# Patient Record
Sex: Male | Born: 1940 | Race: Black or African American | Hispanic: No | Smoking: Former smoker
Health system: Southern US, Community
[De-identification: ages and names within clinical notes are randomized; demographics above are authoritative.]

## PROBLEM LIST (undated history)

## (undated) DIAGNOSIS — I10 Essential (primary) hypertension: Secondary | ICD-10-CM

## (undated) DIAGNOSIS — E7800 Pure hypercholesterolemia, unspecified: Secondary | ICD-10-CM

## (undated) HISTORY — PX: COLONOSCOPY: SHX174

---

## 2008-09-07 ENCOUNTER — Emergency Department (HOSPITAL_COMMUNITY): Admission: EM | Admit: 2008-09-07 | Discharge: 2008-09-07 | Payer: Self-pay | Admitting: Emergency Medicine

## 2008-12-17 ENCOUNTER — Inpatient Hospital Stay (HOSPITAL_COMMUNITY): Admission: EM | Admit: 2008-12-17 | Discharge: 2008-12-17 | Payer: Self-pay | Admitting: Emergency Medicine

## 2009-01-31 ENCOUNTER — Telehealth (INDEPENDENT_AMBULATORY_CARE_PROVIDER_SITE_OTHER): Payer: Self-pay | Admitting: *Deleted

## 2009-09-29 ENCOUNTER — Ambulatory Visit: Payer: Self-pay | Admitting: Vascular Surgery

## 2009-09-29 ENCOUNTER — Encounter (INDEPENDENT_AMBULATORY_CARE_PROVIDER_SITE_OTHER): Payer: Self-pay | Admitting: Cardiology

## 2009-09-29 ENCOUNTER — Ambulatory Visit (HOSPITAL_COMMUNITY): Admission: RE | Admit: 2009-09-29 | Discharge: 2009-09-29 | Payer: Self-pay | Admitting: Cardiology

## 2010-06-11 ENCOUNTER — Emergency Department (HOSPITAL_COMMUNITY): Admission: EM | Admit: 2010-06-11 | Discharge: 2010-06-11 | Payer: Self-pay | Admitting: Emergency Medicine

## 2010-11-21 LAB — CK TOTAL AND CKMB (NOT AT ARMC)
CK, MB: 2.6 ng/mL (ref 0.3–4.0)
Relative Index: 1.2 (ref 0.0–2.5)
Total CK: 222 U/L (ref 7–232)

## 2010-11-21 LAB — POCT CARDIAC MARKERS
CKMB, poc: 1.7 ng/mL (ref 1.0–8.0)
Troponin i, poc: <0.05 ng/mL (ref 0.00–0.09)

## 2010-11-21 LAB — CBC
HCT: 36.7 % — ABNORMAL LOW (ref 39.0–52.0)
MCV: 85.4 fL (ref 78.0–100.0)
Platelets: 276 10*3/uL (ref 150–400)
RDW: 16 % — ABNORMAL HIGH (ref 11.5–15.5)
WBC: 6.4 10*3/uL (ref 4.0–10.5)

## 2010-11-21 LAB — URIC ACID: Uric Acid, Serum: 7.9 mg/dL — ABNORMAL HIGH (ref 4.0–7.8)

## 2010-11-21 LAB — PROTIME-INR
INR: 1 (ref 0.00–1.49)
Prothrombin Time: 13.5 seconds (ref 11.6–15.2)

## 2010-11-21 LAB — BASIC METABOLIC PANEL
BUN: 26 mg/dL — ABNORMAL HIGH (ref 6–23)
CO2: 26 mEq/L (ref 19–32)
Calcium: 9.1 mg/dL (ref 8.4–10.5)
Creatinine, Ser: 1.32 mg/dL (ref 0.4–1.5)
GFR calc Af Amer: >60 mL/min (ref >60)

## 2010-11-21 LAB — DIFFERENTIAL
Eosinophils Absolute: 0.1 10*3/uL (ref 0.0–0.7)
Eosinophils Relative: 2 % (ref 0–5)
Lymphs Abs: 2.8 10*3/uL (ref 0.7–4.0)
Monocytes Absolute: 0.7 10*3/uL (ref 0.1–1.0)

## 2010-12-29 NOTE — Discharge Summary (Signed)
NAME:  Gary Pierce, Gary Pierce                 ACCOUNT NO.:  [id number]   MEDICAL RECORD NO.:  [id number]          PATIENT TYPE:  INP   LOCATION:  94                         FACILITY:  [facility]   PHYSICIAN:  Mohan N. Terrence Dupont, M.D. DATE OF BIRTH:  02/16/1941   DATE OF ADMISSION:  12/16/2008  DATE OF DISCHARGE:  12/17/2008                               DISCHARGE SUMMARY   ADMITTING DIAGNOSES:  1. Chest pain, sinus bradycardia, rule out coronary insufficiency.  2. Coronary artery disease.  3. History of myocardial infarction in the past.  4. Hypertension.  5. Gastroesophageal reflux disease.  6. History of gouty arthritis.  7. History of tobacco abuse.   DISCHARGE DIAGNOSIS:  1. Chest pain, sinus bradycardia, rule out coronary insufficiency.  2. Coronary artery disease.  3. History of myocardial infarction in the past.  4. Hypertension.  5. Gastroesophageal reflux disease.  6. History of gouty arthritis.  7. History of tobacco abuse.   The patient signed out against medical advice.  The patient has been  advised to follow up with Dr. Montez Morita and continue his home medications.   BRIEF HISTORY AND HOSPITAL COURSE:  Gary Pierce is 70 year old black male  with past medical history significant for coronary artery disease,  history of inferior wall MI in the past, hypertension, gouty arthritis  came to the ER complaining of retrosternal chest pain off and on for the  last few months, grade 7/10 without associated symptoms of nausea,  vomiting, or diaphoresis.  He states initially it starts as abdominal  pain, radiating to the chest.  He took over-the-counter medications for  acid reflux with relief.  Denies any exertional chest pain.  Denies  shortness of breath.  Denies palpitation, lightheadedness, dizziness, or  syncope.  Denies PND, orthopnea, or leg swelling.  Denies any cardiac  workup in recent past.  Denies cough, fever, or chills.   PAST MEDICAL HISTORY:  As above.   PAST SURGICAL  HISTORY:  He has history of gingival surgery in the past.   No allergies.   MEDICATIONS AT HOME:  He is on:  1. Diovan HCT 320/12.5 p.o. daily.  2. Allopurinol 1 tablet daily.  3. Naprosyn 1 tablet twice daily as needed.   SOCIAL HISTORY:  He is divorced, one daughter, smoked one-half pack per  day for 25 years, quit 5 years ago.  No history of alcohol abuse.  He is  retired, worked in [employer].   FAMILY HISTORY:  Father died at age of 1 due to MI.  Mother died of  cancer at age of 68.  One brother died of cirrhosis of liver.  He had  history of alcohol abuse at the age of 45.  One sister during child  birth.   PHYSICAL EXAMINATION:  GENERAL:  He is alert, awake, and oriented x3, in  no acute distress.  VITAL SIGNS:  Blood pressure was 117/65, pulse was 54 and regular.  HEENT:  Conjunctivae was pink.  NECK:  Supple.  No JVD, no bruit.  LUNGS:  Clear to auscultation without rhonchi or rales.  CARDIOVASCULAR:  S1 and  S2 were normal.  There was soft systolic murmur.  There was no S3 gallop.  ABDOMEN:  Soft.  Bowel sounds are present and nontender.  EXTREMITIES:  There is no clubbing, cyanosis, or edema.   EKG showed sinus bradycardia, possible old inferior wall MI, nonspecific  T-wave changes.   LABORATORY DATA:  His CK was 222, MB 2.6, relative index 1.2, troponin I  was 0.02 and 0.05.  CK-MB was 1.7, troponin I was less than 0.05.  His D-  dimer was 0.42, which was in normal range.  Hemoglobin was 12.9,  hematocrit 36.7, white count of 6.4.  Uric acid level was 7.9.  Sodium  was 138, potassium 4.1, chloride 108, bicarb 26, glucose 97, BUN 26,  creatinine 1.32.   BRIEF HOSPITAL COURSE:  The patient was admitted to telemetry unit.  MI  was ruled out by serial enzymes.  The patient was discussed at length  regarding noninvasive stress testing versus left cath, possible PTCA  stenting, its risks and benefits.  The patient initially consented for a  stress test but then decided  to leave against medical advice.  The  patient did not have any episodes of chest pain during the hospital  stay.  The patient understands the risk of leaving the hospital against  medical advice and which may result in MI death and wanted to leave  against medical advice and wanted followup as outpatient with Dr.  Montez Morita.      Allegra Lai. Terrence Dupont, M.D.  Electronically Signed     MNH/MEDQ  D:  02/25/2009  T:  02/26/2009  Job:  [id number]

## 2012-09-08 ENCOUNTER — Other Ambulatory Visit: Payer: Self-pay | Admitting: *Deleted

## 2012-09-08 DIAGNOSIS — I83893 Varicose veins of bilateral lower extremities with other complications: Secondary | ICD-10-CM

## 2012-09-09 ENCOUNTER — Other Ambulatory Visit: Payer: Self-pay | Admitting: *Deleted

## 2012-09-09 DIAGNOSIS — I83893 Varicose veins of bilateral lower extremities with other complications: Secondary | ICD-10-CM

## 2012-10-01 ENCOUNTER — Encounter: Payer: Self-pay | Admitting: Vascular Surgery

## 2012-10-02 ENCOUNTER — Encounter: Payer: Self-pay | Admitting: Vascular Surgery

## 2012-10-02 ENCOUNTER — Ambulatory Visit (INDEPENDENT_AMBULATORY_CARE_PROVIDER_SITE_OTHER): Payer: Medicare Other | Admitting: Vascular Surgery

## 2012-10-02 ENCOUNTER — Encounter (INDEPENDENT_AMBULATORY_CARE_PROVIDER_SITE_OTHER): Payer: Medicare Other | Admitting: *Deleted

## 2012-10-02 VITALS — BP 136/83 | HR 66 | Ht 70.0 in | Wt 223.0 lb

## 2012-10-02 DIAGNOSIS — I83893 Varicose veins of bilateral lower extremities with other complications: Secondary | ICD-10-CM | POA: Insufficient documentation

## 2012-10-02 DIAGNOSIS — M79609 Pain in unspecified limb: Secondary | ICD-10-CM

## 2012-10-02 NOTE — Progress Notes (Signed)
[facility] HISTORY AND PHYSICAL   History of Present Illness:  Patient is a 72 y.o. year old male who presents for evaluation of varicose veins. These have been chronic and progressive over several years. He has occasional intermittent aching pain especially in the right inner thigh area. He has tried compression therapy some in the past. He is not currently wearing compression stockings. He states he does have some left leg varicosities but primarily his concern with his right leg. He denies any prior history of DVT. He has no family history of varicose veins. He does occasionally has some swelling in his left foot. Other medical problems include gout and elevated cholesterol both of which are currently stable.  Past medical history: Gout and elevated cholesterol History reviewed. No pertinent past surgical history.   Social History History  Substance Use Topics  . Smoking status: Former Smoker -- 25 years    Types: Cigarettes    Quit date: 09/18/2012  . Smokeless tobacco: Never Used  . Alcohol Use: No    Family History Family History  Problem Relation Age of Onset  . Cancer Mother   . Heart disease Father   . Hyperlipidemia Father   . Hypertension Father   . Heart attack Father     Allergies  No Known Allergies   Current Outpatient Prescriptions  Medication Sig Dispense Refill  . allopurinol (ZYLOPRIM) 300 MG tablet Take 1 tablet by mouth daily.      Marland Kitchen atorvastatin (LIPITOR) 10 MG tablet Take 1 tablet by mouth daily.      Marland Kitchen COLCRYS 0.6 MG tablet Take 1 tablet by mouth daily.      Marland Kitchen EXFORGE 10-320 MG per tablet Take 1 tablet by mouth daily.      . Tamsulosin HCl (FLOMAX PO) Take by mouth daily.       No current facility-administered medications for this visit.    ROS:   General:  No weight loss, Fever, chills  HEENT: No recent headaches, no nasal bleeding, no visual changes, no sore throat  Neurologic: No dizziness, blackouts,  seizures. No recent symptoms of stroke or mini- stroke. No recent episodes of slurred speech, or temporary blindness.  Cardiac: No recent episodes of chest pain/pressure, no shortness of breath at rest.  No shortness of breath with exertion.  Denies history of atrial fibrillation or irregular heartbeat  Vascular: No history of rest pain in feet.  No history of claudication.  No history of non-healing ulcer, No history of DVT   Pulmonary: No home oxygen, no productive cough, no hemoptysis,  No asthma or wheezing  Musculoskeletal:  [ ]  Arthritis, [ ]  Low back pain,  [ x] Joint pain  Hematologic:No history of hypercoagulable state.  No history of easy bleeding.  No history of anemia  Gastrointestinal: No hematochezia or melena,  No gastroesophageal reflux, no trouble swallowing  Urinary: [ ]  chronic Kidney disease, [ ]  on HD - [ ]  MWF or [ ]  TTHS, [ ]  Burning with urination, [ ]  Frequent urination, [ ]  Difficulty urinating;   Skin: No rashes  Psychological: No history of anxiety,  No history of depression   Physical Examination  Filed Vitals:   10/02/12 1518  BP: 136/83  Pulse: 66  Height: 5\' 10"  (1.778 m)  Weight: 223 lb (101.152 kg)  SpO2: 100%    Body mass index is 32 kg/(m^2).  General:  Alert and oriented, no acute distress HEENT: Normal Neck: No bruit or JVD  Pulmonary: Clear to auscultation bilaterally Cardiac: Regular Rate and Rhythm without murmur Abdomen: Soft, non-tender, non-distended, no mass, no scars Skin: No rash, no ulcer, large cluster of palpable varicosities on the right medial calf 3-4 mm. Several clusters of reticular and spider-type veins right medial inner thigh this is the site that was painful to him, similar small clusters left leg not as prominent Extremity Pulses:  2+ radial, brachial, femoral, 1-2+ dorsalis pedis, posterior tibial pulses bilaterally Musculoskeletal: No deformity trace edema bilaterally  Neurologic: Upper and lower extremity motor  5/5 and symmetric  DATA: The patient had a venous duplex exam today of the right lower extremity. This showed no evidence of DVT. He did have reflux in the superficial femoral and popliteal veins. 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 He was also given a prescription today for bilateral lower extremity compression stockings. He'll return in 3 months to see if his symptoms have improved and to consider possible laser ablation of his greater and lesser saphenous vein in the right leg. He may also require some work on the reticular veins and I will leave this to Dr. Evelena Leyden discretion when he sees him in 3 months.  Ruta Hinds, MD [facility] Office: [phone] Pager: [phone]

## 2012-12-29 ENCOUNTER — Encounter: Payer: Self-pay | Admitting: Vascular Surgery

## 2012-12-30 ENCOUNTER — Ambulatory Visit: Payer: Medicare Other | Admitting: Vascular Surgery

## 2013-01-12 ENCOUNTER — Encounter (HOSPITAL_COMMUNITY): Payer: Self-pay | Admitting: *Deleted

## 2013-01-12 ENCOUNTER — Emergency Department (HOSPITAL_COMMUNITY)
Admission: EM | Admit: 2013-01-12 | Discharge: 2013-01-12 | Disposition: A | Discharge status: Home / Self Care | Payer: PRIVATE HEALTH INSURANCE | Attending: Emergency Medicine | Admitting: Emergency Medicine

## 2013-01-12 ENCOUNTER — Emergency Department (HOSPITAL_COMMUNITY): Payer: PRIVATE HEALTH INSURANCE

## 2013-01-12 DIAGNOSIS — E7800 Pure hypercholesterolemia, unspecified: Secondary | ICD-10-CM | POA: Insufficient documentation

## 2013-01-12 DIAGNOSIS — M25562 Pain in left knee: Secondary | ICD-10-CM

## 2013-01-12 DIAGNOSIS — Z8679 Personal history of other diseases of the circulatory system: Secondary | ICD-10-CM | POA: Insufficient documentation

## 2013-01-12 DIAGNOSIS — M25569 Pain in unspecified knee: Secondary | ICD-10-CM | POA: Insufficient documentation

## 2013-01-12 DIAGNOSIS — Z79899 Other long term (current) drug therapy: Secondary | ICD-10-CM | POA: Insufficient documentation

## 2013-01-12 DIAGNOSIS — I10 Essential (primary) hypertension: Secondary | ICD-10-CM | POA: Insufficient documentation

## 2013-01-12 DIAGNOSIS — Z87891 Personal history of nicotine dependence: Secondary | ICD-10-CM | POA: Insufficient documentation

## 2013-01-12 HISTORY — DX: Essential (primary) hypertension: I10

## 2013-01-12 HISTORY — DX: Pure hypercholesterolemia, unspecified: E78.00

## 2013-01-12 MED ORDER — HYDROCODONE-IBUPROFEN 7.5-200 MG PO TABS: 1.0000 | ORAL_TABLET | Freq: Four times a day (QID) | ORAL | Status: DC | PRN

## 2013-01-12 NOTE — ED Notes (Signed)
Pt is here with left knee pain for a couple of weeks.  Denies injury.

## 2013-01-12 NOTE — ED Provider Notes (Signed)
History     CSN: [id number]  Arrival date & time 01/12/13  1227   First MD Initiated Contact with Patient 01/12/13 1314      Chief Complaint  Patient presents with  . Leg Pain    (Consider location/radiation/quality/duration/timing/severity/associated sxs/prior treatment) HPI  72 year old male presents c/o L knee pain.  Pt reports intermittent pain to the lateral aspect of his L knee ongoing for the past 2 weeks.  Pain is a sharp sensation, non radiating, worsening with weight bearing and movement and improves with rest. No report of injury.  NO fever, chills, cp, sob, doe, back pain, hip or ankle pain.  He takes Aleve on occasion without relief.  He decided to quit smoking a month ago.  No hx of gout.  Pt has hx of varicose vein and has been seen by vascular surgeon Dr. Oneida Alar in feb.  He was given RICe therapy, NSAIDs, and compression stocking as treatment which has helped.  No recent surgery, prolonged bedrest, long trip, hx of cancer.  No prior PE/DVT.  Past Medical History  Diagnosis Date  . Hypertension   . Hypercholesterolemia     History reviewed. No pertinent past surgical history.  Family History  Problem Relation Age of Onset  . Cancer Mother   . Heart disease Father   . Hyperlipidemia Father   . Hypertension Father   . Heart attack Father     History  Substance Use Topics  . Smoking status: Former Smoker -- 25 years    Types: Cigarettes    Quit date: 09/18/2012  . Smokeless tobacco: Never Used  . Alcohol Use: No      Review of Systems  All other systems reviewed and are negative.    Allergies  Review of patient's allergies indicates no known allergies.  Home Medications   Current Outpatient Rx  Name  Route  Sig  Dispense  Refill  . allopurinol (ZYLOPRIM) 300 MG tablet   Oral   Take 1 tablet by mouth daily.         Marland Kitchen atorvastatin (LIPITOR) 10 MG tablet   Oral   Take 1 tablet by mouth daily.         Marland Kitchen COLCRYS 0.6 MG tablet   Oral  Take 1 tablet by mouth daily.         Marland Kitchen EXFORGE 10-320 MG per tablet   Oral   Take 1 tablet by mouth daily.         . tamsulosin (FLOMAX) 0.4 MG CAPS   Oral   Take 0.4 mg by mouth daily.           BP 140/68  Pulse 71  Temp(Src) 98.4 F (36.9 C) (Oral)  Resp 18  Ht $R'5\' 8"'dY$  (1.727 m)  Wt 220 lb (99.791 kg)  BMI 33.46 kg/m2  SpO2 97%  Physical Exam  Nursing note and vitals reviewed. Constitutional: He appears well-developed and well-nourished. No distress.  HENT:  Head: Atraumatic.  Neck: Neck supple.  Abdominal: There is no tenderness.  Musculoskeletal: He exhibits edema (L knee: point tenderness to lateral aspect of knee without obvious deformity, swelling, or bruise.  normal knee flexion/extension.  no joint laxity.  bilateral pitting edema to both ankle, non tender. intact distal pulses ).  Neurological: He is alert.    ED Course  Procedures (including critical care time)  2:25 PM Pt with tenderness to L knee but not to entire leg.  Xray unremarkable.  Tenderness along the IT  band.  No significant risk factors for DVT.  Plan to give pain meds, knee sleeve and to f/u with ortho as needed.  Return precaution discussed.  Doubt septic arthritis.  Could likely be gout although no warmth or swelling expected of gout, pt is NVI.  Labs Reviewed - No data to display Dg Knee Complete 4 Views Left  01/12/2013   *RADIOLOGY REPORT*  Clinical Data: Left knee pain and swelling.  LEFT KNEE - COMPLETE 4+ VIEW  Comparison: None.  Findings: Imaged bones, joints and soft tissues appear normal.  IMPRESSION: Negative exam.   Original Report Authenticated By: Orlean Patten, M.D.     1. Knee pain, left       MDM  BP 140/68  Pulse 71  Temp(Src) 98.4 F (36.9 C) (Oral)  Resp 18  Ht $R'5\' 8"'Yl$  (1.727 m)  Wt 220 lb (99.791 kg)  BMI 33.46 kg/m2  SpO2 97%  I have reviewed nursing notes and vital signs. I personally reviewed the imaging tests through PACS system  I reviewed  available ER/hospitalization records thought the EMR         Domenic Moras, [place] 01/12/13 1431

## 2013-01-12 NOTE — ED Notes (Signed)
Pt c/o left knee pain x 2 weeks. No known injury. Thought it was due to smoking so he bought Nicorette and stopped smoking. Pain continues.

## 2013-01-13 NOTE — ED Provider Notes (Signed)
Medical screening examination/treatment/procedure(s) were performed by non-physician practitioner and as supervising physician I was immediately available for consultation/collaboration.   Mylinda Latina III, MD 01/13/13 [phone]

## 2014-04-15 ENCOUNTER — Emergency Department (HOSPITAL_COMMUNITY)
Admission: EM | Admit: 2014-04-15 | Discharge: 2014-04-15 | Disposition: A | Discharge status: Home / Self Care | Payer: PRIVATE HEALTH INSURANCE | Attending: Emergency Medicine | Admitting: Emergency Medicine

## 2014-04-15 ENCOUNTER — Encounter (HOSPITAL_COMMUNITY): Payer: Self-pay | Admitting: Emergency Medicine

## 2014-04-15 DIAGNOSIS — Z87891 Personal history of nicotine dependence: Secondary | ICD-10-CM | POA: Diagnosis not present

## 2014-04-15 DIAGNOSIS — M25569 Pain in unspecified knee: Secondary | ICD-10-CM | POA: Insufficient documentation

## 2014-04-15 DIAGNOSIS — E7800 Pure hypercholesterolemia, unspecified: Secondary | ICD-10-CM | POA: Insufficient documentation

## 2014-04-15 DIAGNOSIS — Z79899 Other long term (current) drug therapy: Secondary | ICD-10-CM | POA: Insufficient documentation

## 2014-04-15 DIAGNOSIS — M79604 Pain in right leg: Secondary | ICD-10-CM

## 2014-04-15 DIAGNOSIS — M79609 Pain in unspecified limb: Secondary | ICD-10-CM

## 2014-04-15 DIAGNOSIS — I10 Essential (primary) hypertension: Secondary | ICD-10-CM | POA: Insufficient documentation

## 2014-04-15 MED ORDER — HYDROCODONE-ACETAMINOPHEN 5-325 MG PO TABS: ORAL_TABLET | ORAL | Status: DC

## 2014-04-15 NOTE — ED Provider Notes (Signed)
CSN: [id number]     Arrival date & time 04/15/14  1434 History  This chart was scribed for non-physician practitioner, Alecia Lemming, PA-C working with Ezequiel Essex, MD by Frederich Balding, ED scribe. This patient was seen in room TR07C/TR07C and the patient's care was started at 3:03 PM.     Chief Complaint  Patient presents with  . Leg Pain    The patient says he has right knee since saturday.  He has been diagnosed with gout and thinks maybe he ate something that may have flared up the gout.  He denies injury but says it started when he was washing his vehicle saturday.   The history is provided by the patient. No language interpreter was used.   HPI Comments: Gary Pierce is a 73 y.o. male who presents to the Emergency Department complaining of gradual onset right posterior knee pain that started 5 days ago. Pain radiates into his upper calf. Denies injury. Pt thinks he might have eaten something that may have caused a gout flare up. He has been taking his gout medications with no relief but states they normally provide relief. Pt has also used a knee brace with no relief. He has not taken any other medications for his symptoms. Ambulation worsens the pain. Pt does not take blood thinners daily. Denies history of blood clots.   Past Medical History  Diagnosis Date  . Hypertension   . Hypercholesterolemia    History reviewed. No pertinent past surgical history. Family History  Problem Relation Age of Onset  . Cancer Mother   . Heart disease Father   . Hyperlipidemia Father   . Hypertension Father   . Heart attack Father    History  Substance Use Topics  . Smoking status: Former Smoker -- 25 years    Types: Cigarettes    Quit date: 09/18/2012  . Smokeless tobacco: Never Used  . Alcohol Use: No    Review of Systems  Constitutional: Negative for fever.  HENT: Negative for congestion.   Eyes: Negative for redness.  Respiratory: Negative for shortness of breath.   Cardiovascular:  Negative for chest pain.  Gastrointestinal: Negative for abdominal distention.  Musculoskeletal: Positive for arthralgias.  Skin: Negative for rash.  Neurological: Negative for speech difficulty.  Psychiatric/Behavioral: Negative for confusion.   Allergies  Review of patient's allergies indicates no known allergies.  Home Medications   Prior to Admission medications   Medication Sig Start Date End Date Taking? Authorizing Provider  allopurinol (ZYLOPRIM) 300 MG tablet Take 1 tablet by mouth daily. 09/12/12   Historical Provider, MD  atorvastatin (LIPITOR) 10 MG tablet Take 1 tablet by mouth daily. 09/12/12   Historical Provider, MD  COLCRYS 0.6 MG tablet Take 1 tablet by mouth daily. 09/23/12   Historical Provider, MD  EXFORGE 10-320 MG per tablet Take 1 tablet by mouth daily. 08/28/12   Historical Provider, MD  HYDROcodone-ibuprofen (VICOPROFEN) 7.5-200 MG per tablet Take 1 tablet by mouth every 6 (six) hours as needed for pain. 01/12/13   Domenic Moras, PA-C  tamsulosin (FLOMAX) 0.4 MG CAPS Take 0.4 mg by mouth daily.    Historical Provider, MD   BP 143/81  Pulse 75  Temp(Src) 97.5 F (36.4 C) (Oral)  Resp 16  SpO2 95%  Physical Exam  Nursing note and vitals reviewed. Constitutional: He is oriented to person, place, and time. He appears well-developed and well-nourished. No distress.  HENT:  Head: Normocephalic and atraumatic.  Eyes: Conjunctivae and EOM are normal.  Neck: Neck supple. No tracheal deviation present.  Cardiovascular: Normal rate.   Pulmonary/Chest: Effort normal. No respiratory distress.  Musculoskeletal: Normal range of motion. He exhibits tenderness. He exhibits no edema.       Right hip: Normal.       Right knee: Normal.       Right ankle: Normal.       Right upper leg: He exhibits no tenderness, no bony tenderness and no swelling.       Right lower leg: He exhibits tenderness. He exhibits no bony tenderness, no swelling and no edema.       Legs:      Right  foot: Normal.  Neurological: He is alert and oriented to person, place, and time.  Skin: Skin is warm and dry.  Psychiatric: He has a normal mood and affect. His behavior is normal.    ED Course  Procedures (including critical care time)  DIAGNOSTIC STUDIES: Oxygen Saturation is 95% on RA, adequate by my interpretation.    COORDINATION OF CARE: 3:08 PM-Discussed treatment plan which includes an ultrasound with pt at bedside and pt agreed to plan.   Labs Review Labs Reviewed - No data to display  Imaging Review No results found.   EKG Interpretation None      Vital signs reviewed and are as follows: Filed Vitals:   04/15/14 1459  BP: 143/81  Pulse: 75  Temp: 97.5 F (36.4 C)  Resp: 16    4:43 PM [place] neg. Pt informed. Will treat pain. Encouraged to f/u with PCP in 3 days if not improved.   Pt unable to take NSAIDs.   Pt to use pain medication only under direct supervision at the lowest possible dose needed to control your pain.   Exam unchanged at discharge.     MDM   Final diagnoses:  Pain of right lower extremity   Patient with R calf pain -- no real knee pain on exam, FROM of joint, no effusion or redness. Do not suspect gout. Pt with h/o venous insufficiency. Ultrasound ordered to rule out DVT. This was negative. Pain is not consistent with claudication or arterial issue. Pain is not consistent with neuropathic pain. It is worse with ambulation and movement. Will treat with analgesics and have patient follow up with PCP as needed. Lower extremity is neurovascularly intact.  I personally performed the services described in this documentation, which was scribed in my presence. The recorded information has been reviewed and is accurate.  Carlisle Cater, PA-C 04/15/14 1646

## 2014-04-15 NOTE — Progress Notes (Signed)
VASCULAR LAB PRELIMINARY  PRELIMINARY  PRELIMINARY  PRELIMINARY  Right lower extremity venous Doppler completed.    Preliminary report:  There is no DVT or SVT noted in the right lower extremity.   Braniya Farrugia, RVT 04/15/2014, 4:13 PM

## 2014-04-15 NOTE — Discharge Instructions (Signed)
Please read and follow all provided instructions.  Your diagnoses today include:  1. Pain of right lower extremity    Tests performed today include:  Ultrasound of your leg - does not show any signs of blood clots  Vital signs. See below for your results today.   Medications prescribed:   Vicodin (hydrocodone/acetaminophen) - narcotic pain medication  DO NOT drive or perform any activities that require you to be awake and alert because this medicine can make you drowsy. BE VERY CAREFUL not to take multiple medicines containing Tylenol (also called acetaminophen). Doing so can lead to an overdose which can damage your liver and cause liver failure and possibly death.  Use pain medication only under direct supervision at the lowest possible dose needed to control your pain.   Take any prescribed medications only as directed.  Home care instructions:   Follow any educational materials contained in this packet  Follow R.I.C.E. Protocol:  R - rest your injury   I  - use ice on injury without applying directly to skin  C - compress injury with bandage or splint  E - elevate the injury as much as possible  Follow-up instructions: Please follow-up with your primary care provider if you continue to have significant pain or trouble walking in 3 days.  Return instructions:   Please return if your toes are numb or tingling, appear gray or blue, or you have severe pain (also elevate leg and loosen splint or wrap if you were given one)  Please return to the Emergency Department if you experience worsening symptoms.   Please return if you have any other emergent concerns.  Additional Information:  Your vital signs today were: BP 143/81   Pulse 75   Temp(Src) 97.5 F (36.4 C) (Oral)   Resp 16   SpO2 95% If your blood pressure (BP) was elevated above 135/85 this visit, please have this repeated by your doctor within one month. --------------

## 2014-04-15 NOTE — ED Notes (Signed)
The patient says he has right knee since saturday.  He has been diagnosed with gout and thinks maybe he ate something that may have flared up the gout.  He denies injury but says it started when he was washing his vehicle saturday.  He rates his pain 9/10.

## 2014-04-15 NOTE — ED Notes (Signed)
Declined W/C at D/C and was escorted to lobby by RN. 

## 2014-04-15 NOTE — ED Provider Notes (Signed)
Medical screening examination/treatment/procedure(s) were performed by non-physician practitioner and as supervising physician I was immediately available for consultation/collaboration.   EKG Interpretation None        Ezequiel Essex, MD 04/15/14 2336

## 2014-07-19 ENCOUNTER — Encounter: Payer: Self-pay | Admitting: Internal Medicine

## 2014-07-19 ENCOUNTER — Ambulatory Visit: Payer: PRIVATE HEALTH INSURANCE | Attending: Internal Medicine | Admitting: Internal Medicine

## 2014-07-19 VITALS — BP 160/90 | HR 67 | Temp 98.0°F | Resp 16 | Wt 139.8 lb

## 2014-07-19 DIAGNOSIS — N529 Male erectile dysfunction, unspecified: Secondary | ICD-10-CM | POA: Diagnosis not present

## 2014-07-19 DIAGNOSIS — J45901 Unspecified asthma with (acute) exacerbation: Secondary | ICD-10-CM | POA: Insufficient documentation

## 2014-07-19 DIAGNOSIS — E785 Hyperlipidemia, unspecified: Secondary | ICD-10-CM | POA: Insufficient documentation

## 2014-07-19 DIAGNOSIS — Z87891 Personal history of nicotine dependence: Secondary | ICD-10-CM | POA: Diagnosis not present

## 2014-07-19 DIAGNOSIS — N400 Enlarged prostate without lower urinary tract symptoms: Secondary | ICD-10-CM | POA: Diagnosis not present

## 2014-07-19 DIAGNOSIS — M109 Gout, unspecified: Secondary | ICD-10-CM | POA: Diagnosis present

## 2014-07-19 DIAGNOSIS — J4521 Mild intermittent asthma with (acute) exacerbation: Secondary | ICD-10-CM | POA: Diagnosis not present

## 2014-07-19 DIAGNOSIS — Z8639 Personal history of other endocrine, nutritional and metabolic disease: Secondary | ICD-10-CM

## 2014-07-19 DIAGNOSIS — J45909 Unspecified asthma, uncomplicated: Secondary | ICD-10-CM | POA: Insufficient documentation

## 2014-07-19 DIAGNOSIS — I10 Essential (primary) hypertension: Secondary | ICD-10-CM | POA: Diagnosis not present

## 2014-07-19 DIAGNOSIS — Z8739 Personal history of other diseases of the musculoskeletal system and connective tissue: Secondary | ICD-10-CM | POA: Insufficient documentation

## 2014-07-19 DIAGNOSIS — E782 Mixed hyperlipidemia: Secondary | ICD-10-CM | POA: Insufficient documentation

## 2014-07-19 LAB — CBC WITH DIFFERENTIAL/PLATELET
BASOS ABS: 0 10*3/uL (ref 0.0–0.1)
BASOS PCT: 0 % (ref 0–1)
EOS ABS: 0.2 10*3/uL (ref 0.0–0.7)
EOS PCT: 3 % (ref 0–5)
HCT: 35.6 % — ABNORMAL LOW (ref 39.0–52.0)
Hemoglobin: 12.7 g/dL — ABNORMAL LOW (ref 13.0–17.0)
LYMPHS PCT: 49 % — AB (ref 12–46)
Lymphs Abs: 2.9 10*3/uL (ref 0.7–4.0)
MCH: 28.4 pg (ref 26.0–34.0)
MCHC: 35.7 g/dL (ref 30.0–36.0)
MCV: 79.6 fL (ref 78.0–100.0)
MONO ABS: 0.6 10*3/uL (ref 0.1–1.0)
MPV: 10.4 fL (ref 9.4–12.4)
Monocytes Relative: 10 % (ref 3–12)
Neutro Abs: 2.2 10*3/uL (ref 1.7–7.7)
Neutrophils Relative %: 38 % — ABNORMAL LOW (ref 43–77)
PLATELETS: 240 10*3/uL (ref 150–400)
RBC: 4.47 MIL/uL (ref 4.22–5.81)
RDW: 15.4 % (ref 11.5–15.5)
WBC: 5.9 10*3/uL (ref 4.0–10.5)

## 2014-07-19 LAB — COMPLETE METABOLIC PANEL WITH GFR
ALT: 27 U/L (ref 0–53)
AST: 29 U/L (ref 0–37)
Albumin: 3.8 g/dL (ref 3.5–5.2)
Alkaline Phosphatase: 74 U/L (ref 39–117)
BILIRUBIN TOTAL: 0.8 mg/dL (ref 0.2–1.2)
BUN: 14 mg/dL (ref 6–23)
CALCIUM: 9.2 mg/dL (ref 8.4–10.5)
CHLORIDE: 103 meq/L (ref 96–112)
CO2: 29 mEq/L (ref 19–32)
CREATININE: 1.27 mg/dL (ref 0.50–1.35)
GFR, Est African American: 63 mL/min
GFR, Est Non African American: 55 mL/min — ABNORMAL LOW
Glucose, Bld: 94 mg/dL (ref 70–99)
Potassium: 4.6 mEq/L (ref 3.5–5.3)
Sodium: 141 mEq/L (ref 135–145)
Total Protein: 6.7 g/dL (ref 6.0–8.3)

## 2014-07-19 LAB — LIPID PANEL
CHOLESTEROL: 156 mg/dL (ref 0–200)
HDL: 45 mg/dL (ref >39)
LDL CALC: 89 mg/dL (ref 0–99)
Total CHOL/HDL Ratio: 3.5 Ratio
Triglycerides: 111 mg/dL (ref <150)
VLDL: 22 mg/dL (ref 0–40)

## 2014-07-19 LAB — URIC ACID: URIC ACID, SERUM: 4.6 mg/dL (ref 4.0–7.8)

## 2014-07-19 LAB — HEMOGLOBIN A1C
Hgb A1c MFr Bld: 6 % — ABNORMAL HIGH (ref <5.7)
Mean Plasma Glucose: 126 mg/dL — ABNORMAL HIGH (ref <117)

## 2014-07-19 MED ORDER — ALBUTEROL SULFATE (2.5 MG/3ML) 0.083% IN NEBU: 2.5000 mg | INHALATION_SOLUTION | Freq: Four times a day (QID) | RESPIRATORY_TRACT | Status: DC | PRN

## 2014-07-19 MED ORDER — COLCHICINE 0.6 MG PO TABS: 0.6000 mg | ORAL_TABLET | ORAL | Status: DC

## 2014-07-19 MED ORDER — TADALAFIL 5 MG PO TABS: 5.0000 mg | ORAL_TABLET | Freq: Every day | ORAL | Status: DC | PRN

## 2014-07-19 MED ORDER — AMLODIPINE BESYLATE 2.5 MG PO TABS: 2.5000 mg | ORAL_TABLET | Freq: Every day | ORAL | Status: DC

## 2014-07-19 NOTE — Progress Notes (Signed)
Patient here to establish care  has history of asthma and htn Here for  Routine physical

## 2014-07-19 NOTE — Progress Notes (Signed)
Patient Demographics  Gary Pierce, is a 73 y.o. male  EGB:[id number]  [id number]:[id number]  DOB - 1941/01/04  CC:  Chief Complaint  Patient presents with  . Establish Care       HPI: [name] is a 73 y.o. male here today to establish medical care.patient has history of gout, hyperlipidemia, asthma, hypertension, BPH, erectile dysfunction, his blood pressure today is borderline elevated patient denies any headache dizziness chest and shortness of breath, currently patient is taking Benicar 40 mg daily, reviewed previous medical record he used to be on amlodipine as well , as per patient he had a gout attack 2 weeks ago which is now improved and is requesting refill on colchicine and he also takes allopurinol, he is also requesting refill on Cialis which she used when necessary for erectile dysfunction. Patient denies any previous history of heart disease. Patient has No headache, No chest pain, No abdominal pain - No Nausea, No new weakness tingling or numbness, No Cough - SOB.  No Known Allergies Past Medical History  Diagnosis Date  . Hypertension   . Hypercholesterolemia    Current Outpatient Prescriptions on File Prior to Visit  Medication Sig Dispense Refill  . alfuzosin (UROXATRAL) 10 MG 24 hr tablet Take 10 mg by mouth daily.    Marland Kitchen allopurinol (ZYLOPRIM) 300 MG tablet Take 300 mg by mouth daily.     Marland Kitchen atorvastatin (LIPITOR) 10 MG tablet Take 10 mg by mouth daily.     Marland Kitchen BENICAR 40 MG tablet Take 40 mg by mouth daily.    Marland Kitchen HYDROcodone-acetaminophen (NORCO/VICODIN) 5-325 MG per tablet Take 1/2-1 tablets every 6 hours as needed for severe pain. 8 tablet 0   No current facility-administered medications on file prior to visit.   Family History  Problem Relation Age of Onset  . Cancer Mother   . Heart disease Father   . Hyperlipidemia Father   . Hypertension Father   . Heart attack Father    History   Social History  . Marital Status: Legally Separated    Spouse Name:  N/A    Number of Children: N/A  . Years of Education: N/A   Occupational History  . Not on file.   Social History Main Topics  . Smoking status: Former Smoker -- 25 years    Types: Cigarettes    Quit date: 09/18/2012  . Smokeless tobacco: Never Used  . Alcohol Use: No  . Drug Use: No  . Sexual Activity: Not on file   Other Topics Concern  . Not on file   Social History Narrative    Review of Systems: Constitutional: Negative for fever, chills, diaphoresis, activity change, appetite change and fatigue. HENT: Negative for ear pain, nosebleeds, congestion, facial swelling, rhinorrhea, neck pain, neck stiffness and ear discharge.  Eyes: Negative for pain, discharge, redness, itching and visual disturbance. Respiratory: Negative for cough, choking, chest tightness, shortness of breath, wheezing and stridor.  Cardiovascular: Negative for chest pain, palpitations and leg swelling. Gastrointestinal: Negative for abdominal distention. Genitourinary: Negative for dysuria, urgency, frequency, hematuria, flank pain, decreased urine volume, difficulty urinating and dyspareunia.  Musculoskeletal: Negative for back pain, joint swelling, arthralgia and gait problem. Neurological: Negative for dizziness, tremors, seizures, syncope, facial asymmetry, speech difficulty, weakness, light-headedness, numbness and headaches.  Hematological: Negative for adenopathy. Does not bruise/bleed easily. Psychiatric/Behavioral: Negative for hallucinations, behavioral problems, confusion, dysphoric mood, decreased concentration and agitation.    Objective:   Filed Vitals:   07/19/14 0935  BP:  160/90  Pulse:   Temp:   Resp:     Physical Exam: Constitutional: Patient appears well-developed and well-nourished. No distress. HENT: Normocephalic, atraumatic, External right and left ear normal. Oropharynx is clear and moist.  Eyes: Conjunctivae and EOM are normal. PERRLA, no scleral icterus. Neck: Normal  ROM. Neck supple. No JVD. No tracheal deviation. No thyromegaly. CVS: RRR, S1/S2 +, no murmurs, no gallops, no carotid bruit.  Pulmonary: Effort and breath sounds normal, no stridor, rhonchi, wheezes, rales.  Abdominal: Soft. BS +, no distension, tenderness, rebound or guarding.  Musculoskeletal: Normal range of motion. No edema and no tenderness.  Neuro: Alert. Normal reflexes, muscle tone coordination. No cranial nerve deficit. Skin: Skin is warm and dry. No rash noted. Not diaphoretic. No erythema. No pallor. Psychiatric: Normal mood and affect. Behavior, judgment, thought content normal.  Lab Results  Component Value Date   WBC 6.4 12/16/2008   HGB 12.9* 12/16/2008   HCT 36.7* 12/16/2008   MCV 85.4 12/16/2008   PLT 276 12/16/2008   Lab Results  Component Value Date   CREATININE 1.32 12/16/2008   BUN 26* 12/16/2008   NA 138 12/16/2008   K 4.1 12/16/2008   CL 108 12/16/2008   CO2 26 12/16/2008    No results found for: HGBA1C Lipid Panel  No results found for: CHOL, TRIG, HDL, CHOLHDL, VLDL, LDLCALC     Assessment and plan:   1. History of gout  - colchicine (COLCRYS) 0.6 MG tablet; Take 1 tablet (0.6 mg total) by mouth every other day.  Dispense: 30 tablet; Refill: 2 - Uric Acid  2. Essential hypertension Blood pressure is uncontrolled, patient will continue with Benicar I have added amlodipine 2.5 mg daily, patient will come back in 2 weeks for nurse visit BP check. Also ordered blood work. - CBC with Differential - COMPLETE METABOLIC PANEL WITH GFR - Vit D  25 hydroxy (rtn osteoporosis monitoring) - Hemoglobin A1c - amLODipine (NORVASC) 2.5 MG tablet; Take 1 tablet (2.5 mg total) by mouth daily.  Dispense: 90 tablet; Refill: 3  3. BPH (benign prostatic hyperplasia) Currently patient is taking alfuzosin  symptoms are stable.  4. Hyperlipidemia Currently patient is on Lipitor 10 mg, will check lipid panel. - Lipid panel  5. Erectile dysfunction, unspecified  erectile dysfunction type Medication refill done, again advised patient not to take this medication along with his blood pressure medications, patient understand and verbalized instructions. - tadalafil (CIALIS) 5 MG tablet; Take 1 tablet (5 mg total) by mouth daily as needed for erectile dysfunction.  Dispense: 30 tablet; Refill: 3  6. Asthma with acute exacerbation, mild intermittent  - albuterol (PROVENTIL) (2.5 MG/3ML) 0.083% nebulizer solution; Take 3 mLs (2.5 mg total) by nebulization every 6 (six) hours as needed for wheezing or shortness of breath.  Dispense: 75 mL; Refill: 3      Health Maintenance -Colonoscopy: uptodate   -Vaccinations:  uptodate with flu shot   Return in about 3 months (around 10/18/2014) for hypertension, hyperipidemia, BP check in 2 weeks/Nurse Visit.  Lorayne Marek, MD

## 2014-07-19 NOTE — Patient Instructions (Signed)
DASH Eating Plan DASH stands for "Dietary Approaches to Stop Hypertension." The DASH eating plan is a healthy eating plan that has been shown to reduce high blood pressure (hypertension). Additional health benefits may include reducing the risk of type 2 diabetes mellitus, heart disease, and stroke. The DASH eating plan may also help with weight loss. WHAT DO I NEED TO KNOW ABOUT THE DASH EATING PLAN? For the DASH eating plan, you will follow these general guidelines:  Choose foods with a percent daily value for sodium of less than 5% (as listed on the food label).  Use salt-free seasonings or herbs instead of table salt or sea salt.  Check with your health care provider or pharmacist before using salt substitutes.  Eat lower-sodium products, often labeled as "lower sodium" or "no salt added."  Eat fresh foods.  Eat more vegetables, fruits, and low-fat dairy products.  Choose whole grains. Look for the word "whole" as the first word in the ingredient list.  Choose fish and skinless chicken or [place] more often than red meat. Limit fish, poultry, and meat to 6 oz (170 g) each day.  Limit sweets, desserts, sugars, and sugary drinks.  Choose heart-healthy fats.  Limit cheese to 1 oz (28 g) per day.  Eat more home-cooked food and less restaurant, buffet, and fast food.  Limit fried foods.  Cook foods using methods other than frying.  Limit canned vegetables. If you do use them, rinse them well to decrease the sodium.  When eating at a restaurant, ask that your food be prepared with less salt, or no salt if possible. WHAT FOODS CAN I EAT? Seek help from a dietitian for individual calorie needs. Grains Whole grain or whole wheat bread. Brown rice. Whole grain or whole wheat pasta. Quinoa, bulgur, and whole grain cereals. Low-sodium cereals. Corn or whole wheat flour tortillas. Whole grain cornbread. Whole grain crackers. Low-sodium crackers. Vegetables Fresh or frozen vegetables  (raw, steamed, roasted, or grilled). Low-sodium or reduced-sodium tomato and vegetable juices. Low-sodium or reduced-sodium tomato sauce and paste. Low-sodium or reduced-sodium canned vegetables.  Fruits All fresh, canned (in natural juice), or frozen fruits. Meat and Other Protein Products Ground beef (85% or leaner), grass-fed beef, or beef trimmed of fat. Skinless chicken or [place]. Ground chicken or [place]. Pork trimmed of fat. All fish and seafood. Eggs. Dried beans, peas, or lentils. Unsalted nuts and seeds. Unsalted canned beans. Dairy Low-fat dairy products, such as skim or 1% milk, 2% or reduced-fat cheeses, low-fat ricotta or cottage cheese, or plain low-fat yogurt. Low-sodium or reduced-sodium cheeses. Fats and Oils Tub margarines without trans fats. Light or reduced-fat mayonnaise and salad dressings (reduced sodium). Avocado. Safflower, olive, or canola oils. Natural peanut or almond butter. Other Unsalted popcorn and pretzels. The items listed above may not be a complete list of recommended foods or beverages. Contact your dietitian for more options. WHAT FOODS ARE NOT RECOMMENDED? Grains White bread. White pasta. White rice. Refined cornbread. Bagels and croissants. Crackers that contain trans fat. Vegetables Creamed or fried vegetables. Vegetables in a cheese sauce. Regular canned vegetables. Regular canned tomato sauce and paste. Regular tomato and vegetable juices. Fruits Dried fruits. Canned fruit in light or heavy syrup. Fruit juice. Meat and Other Protein Products Fatty cuts of meat. Ribs, chicken wings, bacon, sausage, bologna, salami, chitterlings, fatback, hot dogs, bratwurst, and packaged luncheon meats. Salted nuts and seeds. Canned beans with salt. Dairy Whole or 2% milk, cream, half-and-half, and cream cheese. Whole-fat or sweetened yogurt. Full-fat  cheeses or blue cheese. Nondairy creamers and whipped toppings. Processed cheese, cheese spreads, or cheese  curds. Condiments Onion and garlic salt, seasoned salt, table salt, and sea salt. Canned and packaged gravies. Worcestershire sauce. Tartar sauce. Barbecue sauce. Teriyaki sauce. Soy sauce, including reduced sodium. Steak sauce. Fish sauce. Oyster sauce. Cocktail sauce. Horseradish. Ketchup and mustard. Meat flavorings and tenderizers. Bouillon cubes. Hot sauce. Tabasco sauce. Marinades. Taco seasonings. Relishes. Fats and Oils Butter, stick margarine, lard, shortening, ghee, and bacon fat. Coconut, palm kernel, or palm oils. Regular salad dressings. Other Pickles and olives. Salted popcorn and pretzels. The items listed above may not be a complete list of foods and beverages to avoid. Contact your dietitian for more information. WHERE CAN I FIND MORE INFORMATION? National Heart, [employer], and [employer] Institute: travelstabloid.com Document Released: 07/19/2011 Document Revised: 12/14/2013 Document Reviewed: 06/03/2013 [facility] Patient Information 2015 [place], [place]. This information is not intended to replace advice given to you by your health care provider. Make sure you discuss any questions you have with your health care provider. Gout Gout is an inflammatory arthritis caused by a buildup of uric acid crystals in the joints. Uric acid is a chemical that is normally present in the blood. When the level of uric acid in the blood is too high it can form crystals that deposit in your joints and tissues. This causes joint redness, soreness, and swelling (inflammation). Repeat attacks are common. Over time, uric acid crystals can form into masses (tophi) near a joint, destroying bone and causing disfigurement. Gout is treatable and often preventable. CAUSES  The disease begins with elevated levels of uric acid in the blood. Uric acid is produced by your body when it breaks down a naturally found substance called purines. Certain foods you eat, such as meats and fish, contain  high amounts of purines. Causes of an elevated uric acid level include:  Being passed down from parent to child (heredity).  Diseases that cause increased uric acid production (such as obesity, psoriasis, and certain cancers).  Excessive alcohol use.  Diet, especially diets rich in meat and seafood.  Medicines, including certain cancer-fighting medicines (chemotherapy), water pills (diuretics), and aspirin.  Chronic kidney disease. The kidneys are no longer able to remove uric acid well.  Problems with metabolism. Conditions strongly associated with gout include:  Obesity.  High blood pressure.  High cholesterol.  Diabetes. Not everyone with elevated uric acid levels gets gout. It is not understood why some people get gout and others do not. Surgery, joint injury, and eating too much of certain foods are some of the factors that can lead to gout attacks. SYMPTOMS   An attack of gout comes on quickly. It causes intense pain with redness, swelling, and warmth in a joint.  Fever can occur.  Often, only one joint is involved. Certain joints are more commonly involved:  Base of the big toe.  Knee.  Ankle.  Wrist.  Finger. Without treatment, an attack usually goes away in a few days to weeks. Between attacks, you usually will not have symptoms, which is different from many other forms of arthritis. DIAGNOSIS  Your caregiver will suspect gout based on your symptoms and exam. In some cases, tests may be recommended. The tests may include:  Blood tests.  Urine tests.  X-rays.  Joint fluid exam. This exam requires a needle to remove fluid from the joint (arthrocentesis). Using a microscope, gout is confirmed when uric acid crystals are seen in the joint fluid. TREATMENT  There  are two phases to gout treatment: treating the sudden onset (acute) attack and preventing attacks (prophylaxis).  Treatment of an Acute Attack.  Medicines are used. These include  anti-inflammatory medicines or steroid medicines.  An injection of steroid medicine into the affected joint is sometimes necessary.  The painful joint is rested. Movement can worsen the arthritis.  You may use warm or cold treatments on painful joints, depending which works best for you.  Treatment to Prevent Attacks.  If you suffer from frequent gout attacks, your caregiver may advise preventive medicine. These medicines are started after the acute attack subsides. These medicines either help your kidneys eliminate uric acid from your body or decrease your uric acid production. You may need to stay on these medicines for a very long time.  The early phase of treatment with preventive medicine can be associated with an increase in acute gout attacks. For this reason, during the first few months of treatment, your caregiver may also advise you to take medicines usually used for acute gout treatment. Be sure you understand your caregiver's directions. Your caregiver may make several adjustments to your medicine dose before these medicines are effective.  Discuss dietary treatment with your caregiver or dietitian. Alcohol and drinks high in sugar and fructose and foods such as meat, poultry, and seafood can increase uric acid levels. Your caregiver or dietitian can advise you on drinks and foods that should be limited. HOME CARE INSTRUCTIONS   Do not take aspirin to relieve pain. This raises uric acid levels.  Only take over-the-counter or prescription medicines for pain, discomfort, or fever as directed by your caregiver.  Rest the joint as much as possible. When in bed, keep sheets and blankets off painful areas.  Keep the affected joint raised (elevated).  Apply warm or cold treatments to painful joints. Use of warm or cold treatments depends on which works best for you.  Use crutches if the painful joint is in your leg.  Drink enough fluids to keep your urine clear or pale yellow. This  helps your body get rid of uric acid. Limit alcohol, sugary drinks, and fructose drinks.  Follow your dietary instructions. Pay careful attention to the amount of protein you eat. Your daily diet should emphasize fruits, vegetables, whole grains, and fat-free or low-fat milk products. Discuss the use of coffee, vitamin C, and cherries with your caregiver or dietitian. These may be helpful in lowering uric acid levels.  Maintain a healthy body weight. SEEK MEDICAL CARE IF:   You develop diarrhea, vomiting, or any side effects from medicines.  You do not feel better in 24 hours, or you are getting worse. SEEK IMMEDIATE MEDICAL CARE IF:   Your joint becomes suddenly more tender, and you have chills or a fever. MAKE SURE YOU:   Understand these instructions.  Will watch your condition.  Will get help right away if you are not doing well or get worse. Document Released: 07/27/2000 Document Revised: 12/14/2013 Document Reviewed: 03/12/2012 [facility] Patient Information 2015 [place], [place]. This information is not intended to replace advice given to you by your health care provider. Make sure you discuss any questions you have with your health care provider.

## 2014-07-20 ENCOUNTER — Telehealth: Payer: Self-pay | Admitting: Emergency Medicine

## 2014-07-20 LAB — VITAMIN D 25 HYDROXY (VIT D DEFICIENCY, FRACTURES): Vit D, 25-Hydroxy: 17 ng/mL — ABNORMAL LOW (ref 30–100)

## 2014-07-20 MED ORDER — VITAMIN D (ERGOCALCIFEROL) 1.25 MG (50000 UNIT) PO CAPS: 50000.0000 [IU] | ORAL_CAPSULE | ORAL | Status: DC

## 2014-07-20 NOTE — Telephone Encounter (Signed)
Pt given lab results with instructions on watching diet/low fat carbohydrates as well as vitamin D levels Medication e-scribed to [facility] pharmacy

## 2014-07-20 NOTE — Telephone Encounter (Signed)
-----   Message from Lorayne Marek, MD sent at 07/20/2014 11:28 AM EST ----- Blood work reviewed, noticed low vitamin D, call patient advise to start ergocalciferol 50,000 units once a week for the duration of  12 weeks.  noticed hemoglobin A1c of 6.0%, patient has prediabetes, call and advise patient for low carbohydrate diet.

## 2014-08-02 ENCOUNTER — Ambulatory Visit: Payer: PRIVATE HEALTH INSURANCE | Attending: Internal Medicine | Admitting: *Deleted

## 2014-08-02 VITALS — BP 123/67 | HR 75 | Temp 98.7°F | Resp 18

## 2014-08-02 DIAGNOSIS — I10 Essential (primary) hypertension: Secondary | ICD-10-CM | POA: Diagnosis not present

## 2014-08-02 MED ORDER — ALBUTEROL SULFATE HFA 108 (90 BASE) MCG/ACT IN AERS: 2.0000 | INHALATION_SPRAY | Freq: Four times a day (QID) | RESPIRATORY_TRACT | Status: DC | PRN

## 2014-08-02 MED ORDER — OLMESARTAN MEDOXOMIL 40 MG PO TABS: 40.0000 mg | ORAL_TABLET | Freq: Every day | ORAL | Status: DC

## 2014-08-02 NOTE — Patient Instructions (Signed)
Diabetes Mellitus and Food It is important for you to manage your blood sugar (glucose) level. Your blood glucose level can be greatly affected by what you eat. Eating healthier foods in the appropriate amounts throughout the day at about the same time each day will help you control your blood glucose level. It can also help slow or prevent worsening of your diabetes mellitus. Healthy eating may even help you improve the level of your blood pressure and reach or maintain a healthy weight.  HOW CAN FOOD AFFECT ME? Carbohydrates Carbohydrates affect your blood glucose level more than any other type of food. Your dietitian will help you determine how many carbohydrates to eat at each meal and teach you how to count carbohydrates. Counting carbohydrates is important to keep your blood glucose at a healthy level, especially if you are using insulin or taking certain medicines for diabetes mellitus. Alcohol Alcohol can cause sudden decreases in blood glucose (hypoglycemia), especially if you use insulin or take certain medicines for diabetes mellitus. Hypoglycemia can be a life-threatening condition. Symptoms of hypoglycemia (sleepiness, dizziness, and disorientation) are similar to symptoms of having too much alcohol.  If your health care provider has given you approval to drink alcohol, do so in moderation and use the following guidelines:  Women should not have more than one drink per day, and men should not have more than two drinks per day. One drink is equal to:  12 oz of beer.  5 oz of wine.  1 oz of hard liquor.  Do not drink on an empty stomach.  Keep yourself hydrated. Have water, diet soda, or unsweetened iced tea.  Regular soda, juice, and other mixers might contain a lot of carbohydrates and should be counted. WHAT FOODS ARE NOT RECOMMENDED? As you make food choices, it is important to remember that all foods are not the same. Some foods have fewer nutrients per serving than other  foods, even though they might have the same number of calories or carbohydrates. It is difficult to get your body what it needs when you eat foods with fewer nutrients. Examples of foods that you should avoid that are high in calories and carbohydrates but low in nutrients include:  Trans fats (most processed foods list trans fats on the Nutrition Facts label).  Regular soda.  Juice.  Candy.  Sweets, such as cake, pie, doughnuts, and cookies.  Fried foods. WHAT FOODS CAN I EAT? Have nutrient-rich foods, which will nourish your body and keep you healthy. The food you should eat also will depend on several factors, including:  The calories you need.  The medicines you take.  Your weight.  Your blood glucose level.  Your blood pressure level.  Your cholesterol level. You also should eat a variety of foods, including:  Protein, such as meat, poultry, fish, tofu, nuts, and seeds (lean animal proteins are best).  Fruits.  Vegetables.  Dairy products, such as milk, cheese, and yogurt (low fat is best).  Breads, grains, pasta, cereal, rice, and beans.  Fats such as olive oil, trans fat-free margarine, canola oil, avocado, and olives. DOES EVERYONE WITH DIABETES MELLITUS HAVE THE SAME MEAL PLAN? Because every person with diabetes mellitus is different, there is not one meal plan that works for everyone. It is very important that you meet with a dietitian who will help you create a meal plan that is just right for you. Document Released: 04/26/2005 Document Revised: 08/04/2013 Document Reviewed: 06/26/2013 [facility] Patient Information 2015 [place], [place]. This  information is not intended to replace advice given to you by your health care provider. Make sure you discuss any questions you have with your health care provider. Diabetes and Exercise Exercising regularly is important. It is not just about losing weight. It has many health benefits, such as:  Improving your overall  fitness, flexibility, and endurance.  Increasing your bone density.  Helping with weight control.  Decreasing your body fat.  Increasing your muscle strength.  Reducing stress and tension.  Improving your overall health. People with diabetes who exercise gain additional benefits because exercise:  Reduces appetite.  Improves the body's use of blood sugar (glucose).  Helps lower or control blood glucose.  Decreases blood pressure.  Helps control blood lipids (such as cholesterol and triglycerides).  Improves the body's use of the hormone insulin by:  Increasing the body's insulin sensitivity.  Reducing the body's insulin needs.  Decreases the risk for heart disease because exercising:  Lowers cholesterol and triglycerides levels.  Increases the levels of good cholesterol (such as high-density lipoproteins [HDL]) in the body.  Lowers blood glucose levels. YOUR ACTIVITY PLAN  Choose an activity that you enjoy and set realistic goals. Your health care provider or diabetes educator can help you make an activity plan that works for you. Exercise regularly as directed by your health care provider. This includes:  Performing resistance training twice a week such as push-ups, sit-ups, lifting weights, or using resistance bands.  Performing 150 minutes of cardio exercises each week such as walking, running, or playing sports.  Staying active and spending no more than 90 minutes at one time being inactive. Even short bursts of exercise are good for you. Three 10-minute sessions spread throughout the day are just as beneficial as a single 30-minute session. Some exercise ideas include:  Taking the dog for a walk.  Taking the stairs instead of the elevator.  Dancing to your favorite song.  Doing an exercise video.  Doing your favorite exercise with a friend. RECOMMENDATIONS FOR EXERCISING WITH TYPE 1 OR TYPE 2 DIABETES   Check your blood glucose before exercising. If  blood glucose levels are greater than 240 mg/dL, check for urine ketones. Do not exercise if ketones are present.  Avoid injecting insulin into areas of the body that are going to be exercised. For example, avoid injecting insulin into:  The arms when playing tennis.  The legs when jogging.  Keep a record of:  Food intake before and after you exercise.  Expected peak times of insulin action.  Blood glucose levels before and after you exercise.  The type and amount of exercise you have done.  Review your records with your health care provider. Your health care provider will help you to develop guidelines for adjusting food intake and insulin amounts before and after exercising.  If you take insulin or oral hypoglycemic agents, watch for signs and symptoms of hypoglycemia. They include:  Dizziness.  Shaking.  Sweating.  Chills.  Confusion.  Drink plenty of water while you exercise to prevent dehydration or heat stroke. Body water is lost during exercise and must be replaced.  Talk to your health care provider before starting an exercise program to make sure it is safe for you. Remember, almost any type of activity is better than none. Document Released: 10/20/2003 Document Revised: 12/14/2013 Document Reviewed: 01/06/2013 [facility] Patient Information 2015 [place], [place]. This information is not intended to replace advice given to you by your health care provider. Make sure you discuss any  questions you have with your health care provider.

## 2014-08-02 NOTE — Progress Notes (Signed)
Patient presents for BP check Med list reviewed; states taking Benicar as directed States not taking amlodipine or lipitor States taking a different med for cholesterol  Paient does not have nebulizer and is requesting albuterol MDI Labs reviewed with patient States he follows low sodium diet and has now cut out sugars States he takes Vit D supplement but not the one recently ordered due to mucous production while taking it   BP 123/67 P 75 R 18  T  98.7 oral SPO2 96%  Per PCP: Okay to d/c amlodipine; will restart in future if needed Refill albuterol inhaler  Pt will call back to let us know the name of cholesterol med Patient given literature on Diabetes and Food and Diabetes and Exercise Recommended walking 30 min/day as tolerated  Patient advised to call for med refills at least 7 days before running out so as not to go without. Patient aware that he is to f/u with PCP 3 months from last visit (Due 10/18/14)

## 2014-08-17 ENCOUNTER — Telehealth: Payer: Self-pay | Admitting: *Deleted

## 2014-08-17 ENCOUNTER — Other Ambulatory Visit: Payer: Self-pay

## 2014-08-17 MED ORDER — ALLOPURINOL 300 MG PO TABS: 300.0000 mg | ORAL_TABLET | Freq: Every day | ORAL | Status: DC

## 2014-08-17 NOTE — Telephone Encounter (Signed)
Pt here to make an appointment for medicine refill

## 2014-08-19 ENCOUNTER — Other Ambulatory Visit: Payer: Self-pay

## 2014-08-19 MED ORDER — ALLOPURINOL 300 MG PO TABS: 300.0000 mg | ORAL_TABLET | Freq: Every day | ORAL | Status: DC

## 2014-09-24 ENCOUNTER — Other Ambulatory Visit: Payer: Self-pay | Admitting: Internal Medicine

## 2014-09-26 ENCOUNTER — Other Ambulatory Visit: Payer: Self-pay | Admitting: Internal Medicine

## 2014-10-19 ENCOUNTER — Ambulatory Visit (HOSPITAL_COMMUNITY)
Admission: RE | Admit: 2014-10-19 | Discharge: 2014-10-19 | Disposition: A | Discharge status: Home / Self Care | Payer: Medicare Other | Source: Ambulatory Visit | Attending: Internal Medicine | Admitting: Internal Medicine

## 2014-10-19 ENCOUNTER — Ambulatory Visit: Payer: Medicare Other | Attending: Internal Medicine | Admitting: Internal Medicine

## 2014-10-19 ENCOUNTER — Ambulatory Visit (HOSPITAL_COMMUNITY)
Admission: RE | Admit: 2014-10-19 | Discharge: 2014-10-19 | Disposition: A | Discharge status: Home / Self Care | Payer: Medicare Other | Source: Ambulatory Visit

## 2014-10-19 ENCOUNTER — Telehealth: Payer: Self-pay

## 2014-10-19 ENCOUNTER — Encounter: Payer: Self-pay | Admitting: Internal Medicine

## 2014-10-19 ENCOUNTER — Ambulatory Visit (HOSPITAL_COMMUNITY): Payer: Medicare Other

## 2014-10-19 ENCOUNTER — Other Ambulatory Visit: Payer: Self-pay

## 2014-10-19 VITALS — BP 140/80 | HR 63 | Temp 98.0°F | Resp 16 | Wt 230.4 lb

## 2014-10-19 DIAGNOSIS — I10 Essential (primary) hypertension: Secondary | ICD-10-CM

## 2014-10-19 DIAGNOSIS — E780 Pure hypercholesterolemia: Secondary | ICD-10-CM | POA: Diagnosis not present

## 2014-10-19 DIAGNOSIS — J9811 Atelectasis: Secondary | ICD-10-CM | POA: Diagnosis not present

## 2014-10-19 DIAGNOSIS — Z87891 Personal history of nicotine dependence: Secondary | ICD-10-CM | POA: Insufficient documentation

## 2014-10-19 DIAGNOSIS — R079 Chest pain, unspecified: Secondary | ICD-10-CM

## 2014-10-19 DIAGNOSIS — R0781 Pleurodynia: Secondary | ICD-10-CM | POA: Insufficient documentation

## 2014-10-19 DIAGNOSIS — Z8709 Personal history of other diseases of the respiratory system: Secondary | ICD-10-CM

## 2014-10-19 DIAGNOSIS — J45909 Unspecified asthma, uncomplicated: Secondary | ICD-10-CM | POA: Diagnosis not present

## 2014-10-19 DIAGNOSIS — E559 Vitamin D deficiency, unspecified: Secondary | ICD-10-CM | POA: Diagnosis not present

## 2014-10-19 DIAGNOSIS — Z79899 Other long term (current) drug therapy: Secondary | ICD-10-CM | POA: Diagnosis not present

## 2014-10-19 DIAGNOSIS — Z8249 Family history of ischemic heart disease and other diseases of the circulatory system: Secondary | ICD-10-CM | POA: Diagnosis not present

## 2014-10-19 DIAGNOSIS — Z79891 Long term (current) use of opiate analgesic: Secondary | ICD-10-CM | POA: Insufficient documentation

## 2014-10-19 DIAGNOSIS — R7303 Prediabetes: Secondary | ICD-10-CM

## 2014-10-19 DIAGNOSIS — R05 Cough: Secondary | ICD-10-CM | POA: Diagnosis present

## 2014-10-19 DIAGNOSIS — R7309 Other abnormal glucose: Secondary | ICD-10-CM

## 2014-10-19 LAB — COMPLETE METABOLIC PANEL WITH GFR
ALT: 26 U/L (ref 0–53)
AST: 28 U/L (ref 0–37)
Albumin: 3.9 g/dL (ref 3.5–5.2)
Alkaline Phosphatase: 54 U/L (ref 39–117)
BILIRUBIN TOTAL: 0.9 mg/dL (ref 0.2–1.2)
BUN: 18 mg/dL (ref 6–23)
CO2: 26 meq/L (ref 19–32)
CREATININE: 1.12 mg/dL (ref 0.50–1.35)
Calcium: 8.9 mg/dL (ref 8.4–10.5)
Chloride: 104 mEq/L (ref 96–112)
GFR, EST NON AFRICAN AMERICAN: 64 mL/min
GFR, Est African American: 74 mL/min
Glucose, Bld: 86 mg/dL (ref 70–99)
Potassium: 4.1 mEq/L (ref 3.5–5.3)
SODIUM: 140 meq/L (ref 135–145)
TOTAL PROTEIN: 6.5 g/dL (ref 6.0–8.3)

## 2014-10-19 MED ORDER — AZITHROMYCIN 250 MG PO TABS: ORAL_TABLET | ORAL | Status: DC

## 2014-10-19 NOTE — Patient Instructions (Signed)
Diabetes Mellitus and Food It is important for you to manage your blood sugar (glucose) level. Your blood glucose level can be greatly affected by what you eat. Eating healthier foods in the appropriate amounts throughout the day at about the same time each day will help you control your blood glucose level. It can also help slow or prevent worsening of your diabetes mellitus. Healthy eating may even help you improve the level of your blood pressure and reach or maintain a healthy weight.  HOW CAN FOOD AFFECT ME? Carbohydrates Carbohydrates affect your blood glucose level more than any other type of food. Your dietitian will help you determine how many carbohydrates to eat at each meal and teach you how to count carbohydrates. Counting carbohydrates is important to keep your blood glucose at a healthy level, especially if you are using insulin or taking certain medicines for diabetes mellitus. Alcohol Alcohol can cause sudden decreases in blood glucose (hypoglycemia), especially if you use insulin or take certain medicines for diabetes mellitus. Hypoglycemia can be a life-threatening condition. Symptoms of hypoglycemia (sleepiness, dizziness, and disorientation) are similar to symptoms of having too much alcohol.  If your health care provider has given you approval to drink alcohol, do so in moderation and use the following guidelines:  Women should not have more than one drink per day, and men should not have more than two drinks per day. One drink is equal to:  12 oz of beer.  5 oz of wine.  1 oz of hard liquor.  Do not drink on an empty stomach.  Keep yourself hydrated. Have water, diet soda, or unsweetened iced tea.  Regular soda, juice, and other mixers might contain a lot of carbohydrates and should be counted. WHAT FOODS ARE NOT RECOMMENDED? As you make food choices, it is important to remember that all foods are not the same. Some foods have fewer nutrients per serving than other  foods, even though they might have the same number of calories or carbohydrates. It is difficult to get your body what it needs when you eat foods with fewer nutrients. Examples of foods that you should avoid that are high in calories and carbohydrates but low in nutrients include:  Trans fats (most processed foods list trans fats on the Nutrition Facts label).  Regular soda.  Juice.  Candy.  Sweets, such as cake, pie, doughnuts, and cookies.  Fried foods. WHAT FOODS CAN I EAT? Have nutrient-rich foods, which will nourish your body and keep you healthy. The food you should eat also will depend on several factors, including:  The calories you need.  The medicines you take.  Your weight.  Your blood glucose level.  Your blood pressure level.  Your cholesterol level. You also should eat a variety of foods, including:  Protein, such as meat, poultry, fish, tofu, nuts, and seeds (lean animal proteins are best).  Fruits.  Vegetables.  Dairy products, such as milk, cheese, and yogurt (low fat is best).  Breads, grains, pasta, cereal, rice, and beans.  Fats such as olive oil, trans fat-free margarine, canola oil, avocado, and olives. DOES EVERYONE WITH DIABETES MELLITUS HAVE THE SAME MEAL PLAN? Because every person with diabetes mellitus is different, there is not one meal plan that works for everyone. It is very important that you meet with a dietitian who will help you create a meal plan that is just right for you. Document Released: 04/26/2005 Document Revised: 08/04/2013 Document Reviewed: 06/26/2013 [employer] Patient Information 2015 [employer]. This   information is not intended to replace advice given to you by your health care provider. Make sure you discuss any questions you have with your health care provider. DASH Eating Plan DASH stands for "Dietary Approaches to Stop Hypertension." The DASH eating plan is a healthy eating plan that has been shown to reduce high  blood pressure (hypertension). Additional health benefits may include reducing the risk of type 2 diabetes mellitus, heart disease, and stroke. The DASH eating plan may also help with weight loss. WHAT DO I NEED TO KNOW ABOUT THE DASH EATING PLAN? For the DASH eating plan, you will follow these general guidelines:  Choose foods with a percent daily value for sodium of less than 5% (as listed on the food label).  Use salt-free seasonings or herbs instead of table salt or sea salt.  Check with your health care provider or pharmacist before using salt substitutes.  Eat lower-sodium products, often labeled as "lower sodium" or "no salt added."  Eat fresh foods.  Eat more vegetables, fruits, and low-fat dairy products.  Choose whole grains. Look for the word "whole" as the first word in the ingredient list.  Choose fish and skinless chicken or turkey more often than red meat. Limit fish, poultry, and meat to 6 oz (170 g) each day.  Limit sweets, desserts, sugars, and sugary drinks.  Choose heart-healthy fats.  Limit cheese to 1 oz (28 g) per day.  Eat more home-cooked food and less restaurant, buffet, and fast food.  Limit fried foods.  Cook foods using methods other than frying.  Limit canned vegetables. If you do use them, rinse them well to decrease the sodium.  When eating at a restaurant, ask that your food be prepared with less salt, or no salt if possible. WHAT FOODS CAN I EAT? Seek help from a dietitian for individual calorie needs. Grains Whole grain or whole wheat bread. Brown rice. Whole grain or whole wheat pasta. Quinoa, bulgur, and whole grain cereals. Low-sodium cereals. Corn or whole wheat flour tortillas. Whole grain cornbread. Whole grain crackers. Low-sodium crackers. Vegetables Fresh or frozen vegetables (raw, steamed, roasted, or grilled). Low-sodium or reduced-sodium tomato and vegetable juices. Low-sodium or reduced-sodium tomato sauce and paste. Low-sodium  or reduced-sodium canned vegetables.  Fruits All fresh, canned (in natural juice), or frozen fruits. Meat and Other Protein Products Ground beef (85% or leaner), grass-fed beef, or beef trimmed of fat. Skinless chicken or turkey. Ground chicken or turkey. Pork trimmed of fat. All fish and seafood. Eggs. Dried beans, peas, or lentils. Unsalted nuts and seeds. Unsalted canned beans. Dairy Low-fat dairy products, such as skim or 1% milk, 2% or reduced-fat cheeses, low-fat ricotta or cottage cheese, or plain low-fat yogurt. Low-sodium or reduced-sodium cheeses. Fats and Oils Tub margarines without trans fats. Light or reduced-fat mayonnaise and salad dressings (reduced sodium). Avocado. Safflower, olive, or canola oils. Natural peanut or almond butter. Other Unsalted popcorn and pretzels. The items listed above may not be a complete list of recommended foods or beverages. Contact your dietitian for more options. WHAT FOODS ARE NOT RECOMMENDED? Grains White bread. White pasta. White rice. Refined cornbread. Bagels and croissants. Crackers that contain trans fat. Vegetables Creamed or fried vegetables. Vegetables in a cheese sauce. Regular canned vegetables. Regular canned tomato sauce and paste. Regular tomato and vegetable juices. Fruits Dried fruits. Canned fruit in light or heavy syrup. Fruit juice. Meat and Other Protein Products Fatty cuts of meat. Ribs, chicken wings, bacon, sausage, bologna, salami, chitterlings, fatback, hot   dogs, bratwurst, and packaged luncheon meats. Salted nuts and seeds. Canned beans with salt. Dairy Whole or 2% milk, cream, half-and-half, and cream cheese. Whole-fat or sweetened yogurt. Full-fat cheeses or blue cheese. Nondairy creamers and whipped toppings. Processed cheese, cheese spreads, or cheese curds. Condiments Onion and garlic salt, seasoned salt, table salt, and sea salt. Canned and packaged gravies. Worcestershire sauce. Tartar sauce. Barbecue sauce.  Teriyaki sauce. Soy sauce, including reduced sodium. Steak sauce. Fish sauce. Oyster sauce. Cocktail sauce. Horseradish. Ketchup and mustard. Meat flavorings and tenderizers. Bouillon cubes. Hot sauce. Tabasco sauce. Marinades. Taco seasonings. Relishes. Fats and Oils Butter, stick margarine, lard, shortening, ghee, and bacon fat. Coconut, palm kernel, or palm oils. Regular salad dressings. Other Pickles and olives. Salted popcorn and pretzels. The items listed above may not be a complete list of foods and beverages to avoid. Contact your dietitian for more information. WHERE CAN I FIND MORE INFORMATION? National Heart, Lung, and [employer] Institute: www.nhlbi.nih.gov/health/health-topics/topics/dash/ Document Released: 07/19/2011 Document Revised: 12/14/2013 Document Reviewed: 06/03/2013 [employer] Patient Information 2015 [employer]. This information is not intended to replace advice given to you by your health care provider. Make sure you discuss any questions you have with your health care provider.  

## 2014-10-19 NOTE — Progress Notes (Signed)
Patient here for follow up Patient complains of having chest pain when he bends over Or coughs Symptoms started about a  Week ago

## 2014-10-19 NOTE — Progress Notes (Signed)
MRN: [id number] Name: Blanchard Willhite  Sex: male Age: 74 y.o. DOB: 01/05/1941  Allergies: Review of patient's allergies indicates no known allergies.  Chief Complaint  Patient presents with  . Follow-up    HPI: Patient is 74 y.o. male who has history of hypertension, today's blood pressure is borderline elevated as per patient is not taking his blood pressure medication, he also reported to have chest pain which is worse with coughing and worse with certain movement, denies any fever chills denies any orthopnea PND or leg swelling denies any exertional chest pain. Today EKG done in the office shows normal sinus rhythm with left axis deviation no significant ST-T changes. Patient also had previous blood work done which was reviewed noticed vitamin D deficiency as well ashemoglobin A1c of 6.0%, patient has prediabetes  Past Medical History  Diagnosis Date  . Hypertension   . Hypercholesterolemia     Past Surgical History  Procedure Laterality Date  . Colonoscopy      2015  due in 2017      Medication List       This list is accurate as of: 10/19/14 11:18 AM.  Always use your most recent med list.               albuterol 108 (90 BASE) MCG/ACT inhaler  Commonly known as:  PROVENTIL HFA;VENTOLIN HFA  Inhale 2 puffs into the lungs every 6 (six) hours as needed for wheezing or shortness of breath.     alfuzosin 10 MG 24 hr tablet  Commonly known as:  UROXATRAL  Take 10 mg by mouth daily.     allopurinol 300 MG tablet  Commonly known as:  ZYLOPRIM  TAKE ONE TABLET BY MOUTH ONCE DAILY     atorvastatin 10 MG tablet  Commonly known as:  LIPITOR  Take 10 mg by mouth daily.     colchicine 0.6 MG tablet  Commonly known as:  COLCRYS  Take 1 tablet (0.6 mg total) by mouth every other day.     HYDROcodone-acetaminophen 5-325 MG per tablet  Commonly known as:  NORCO/VICODIN  Take 1/2-1 tablets every 6 hours as needed for severe pain.     olmesartan 40 MG tablet  Commonly  known as:  BENICAR  Take 1 tablet (40 mg total) by mouth daily.     tadalafil 5 MG tablet  Commonly known as:  CIALIS  Take 1 tablet (5 mg total) by mouth daily as needed for erectile dysfunction.     Vitamin D (Ergocalciferol) 50000 UNITS Caps capsule  Commonly known as:  DRISDOL  Take 1 capsule (50,000 Units total) by mouth every 7 (seven) days.        No orders of the defined types were placed in this encounter.    Immunization History  Administered Date(s) Administered  . Influenza-Unspecified 05/03/2014    Family History  Problem Relation Age of Onset  . Cancer Mother   . Heart disease Father   . Hyperlipidemia Father   . Hypertension Father   . Heart attack Father     History  Substance Use Topics  . Smoking status: Former Smoker -- 25 years    Types: Cigarettes    Quit date: 09/18/2012  . Smokeless tobacco: Never Used  . Alcohol Use: No    Review of Systems   As noted in HPI  Filed Vitals:   10/19/14 1016  BP: 140/80  Pulse:   Temp:   Resp:     Physical  Exam  Physical Exam  Constitutional: No distress.  Eyes: EOM are normal. Pupils are equal, round, and reactive to light.  Neck: Neck supple.  Cardiovascular: Normal rate and regular rhythm.   Pulmonary/Chest: Breath sounds normal. No respiratory distress. He has no wheezes. He has no rales.  Some tenderness on the left side chest with palpation  Musculoskeletal: He exhibits no edema.    CBC    Component Value Date/Time   WBC 5.9 07/19/2014 0946   RBC 4.47 07/19/2014 0946   HGB 12.7* 07/19/2014 0946   HCT 35.6* 07/19/2014 0946   PLT 240 07/19/2014 0946   MCV 79.6 07/19/2014 0946   LYMPHSABS 2.9 07/19/2014 0946   MONOABS 0.6 07/19/2014 0946   EOSABS 0.2 07/19/2014 0946   BASOSABS 0.0 07/19/2014 0946    CMP     Component Value Date/Time   NA 141 07/19/2014 0946   K 4.6 07/19/2014 0946   CL 103 07/19/2014 0946   CO2 29 07/19/2014 0946   GLUCOSE 94 07/19/2014 0946   BUN 14  07/19/2014 0946   CREATININE 1.27 07/19/2014 0946   CREATININE 1.32 12/16/2008 2200   CALCIUM 9.2 07/19/2014 0946   PROT 6.7 07/19/2014 0946   ALBUMIN 3.8 07/19/2014 0946   AST 29 07/19/2014 0946   ALT 27 07/19/2014 0946   ALKPHOS 74 07/19/2014 0946   BILITOT 0.8 07/19/2014 0946   GFRNONAA 55* 07/19/2014 0946   GFRNONAA 54* 12/16/2008 2200   GFRAA 63 07/19/2014 0946   GFRAA  12/16/2008 2200    >60        The eGFR has been calculated using the MDRD equation. This calculation has not been validated in all clinical situations. eGFR's persistently <60 mL/min signify possible Chronic Kidney Disease.    Lab Results  Component Value Date/Time   CHOL 156 07/19/2014 09:46 AM    No components found for: HGA1C  Lab Results  Component Value Date/Time   AST 29 07/19/2014 09:46 AM    Assessment and Plan  pleuritic chest pain  - Plan: EKG 12-Lead normal sinus rhythm with no acute ST-T changes, we'll get chest x-ray, DG Chest 2 View  Essential hypertension - Plan: advised patient for DASH diet, continue with current medicationsCOMPLETE METABOLIC PANEL WITH GFR  History of asthma Symptoms are stable continue with albuterol when necessary  Vitamin D deficiency Currently patient is taking vitamin D supplement.  Prediabetes - Plan: Advised patient for low carbohydrate diet, repeat blood chemistry COMPLETE METABOLIC PANEL WITH GFR  Addendum Chest x-ray reported bronchitis changes, since patient is having symptoms, will treat with a Z-Pak  Health Maintenance  -Vaccinations:  Up-to-date with flu shot.  Return in about 3 months (around 01/19/2015) for hypertension.   This note has been created with Surveyor, quantity. Any transcriptional errors are unintentional.    Lorayne Marek, MD

## 2014-10-19 NOTE — Telephone Encounter (Signed)
Spoke with patient about his chest x ray and patient is aware To pick up antibiotic at the pharmacy

## 2014-10-21 ENCOUNTER — Telehealth: Payer: Self-pay

## 2014-10-21 NOTE — Telephone Encounter (Signed)
-----   Message from Lorayne Marek, MD sent at 10/20/2014  9:18 AM EST ----- Call and let the Patient know that blood work is normal.

## 2014-10-21 NOTE — Telephone Encounter (Signed)
Patient not available Left message on voice mail blood work was normal

## 2014-10-25 ENCOUNTER — Other Ambulatory Visit: Payer: Self-pay | Admitting: Internal Medicine

## 2014-10-26 ENCOUNTER — Other Ambulatory Visit: Payer: Self-pay | Admitting: Internal Medicine

## 2014-11-01 ENCOUNTER — Telehealth: Payer: Self-pay | Admitting: General Practice

## 2014-11-01 NOTE — Telephone Encounter (Signed)
Patient requesting medication refill for the following: allopurinol (ZYLOPRIM) 300 MG tablet   . Patient is currently all out of this medication. Please follow up. Patient uses Paediatric nurse on [address]

## 2014-11-02 ENCOUNTER — Other Ambulatory Visit: Payer: Self-pay | Admitting: Internal Medicine

## 2014-11-02 ENCOUNTER — Telehealth: Payer: Self-pay | Admitting: Internal Medicine

## 2014-11-02 NOTE — Telephone Encounter (Signed)
Pt called requesting medication refill on allopurinol (ZYLOPRIM) 300 MG tablet. Pt states he feel his hands swelling up without medication . Please f/u with pt

## 2014-11-08 ENCOUNTER — Other Ambulatory Visit: Payer: Self-pay

## 2014-11-08 ENCOUNTER — Telehealth: Payer: Self-pay

## 2014-11-08 MED ORDER — ALLOPURINOL 300 MG PO TABS: 300.0000 mg | ORAL_TABLET | Freq: Every day | ORAL | Status: DC

## 2014-11-08 NOTE — Telephone Encounter (Signed)
Patient called requesting a refill on his allopurinol Prescriptions sent to [facility]

## 2014-12-03 ENCOUNTER — Other Ambulatory Visit: Payer: Self-pay | Admitting: Internal Medicine

## 2014-12-13 ENCOUNTER — Other Ambulatory Visit: Payer: Self-pay | Admitting: Internal Medicine

## 2015-01-05 ENCOUNTER — Other Ambulatory Visit: Payer: Self-pay | Admitting: Internal Medicine

## 2015-01-11 ENCOUNTER — Telehealth: Payer: Self-pay

## 2015-01-11 ENCOUNTER — Telehealth: Payer: Self-pay | Admitting: Internal Medicine

## 2015-01-11 MED ORDER — OLMESARTAN MEDOXOMIL 40 MG PO TABS: ORAL_TABLET | ORAL | Status: DC

## 2015-01-11 NOTE — Telephone Encounter (Signed)
Patient called stating he ran out of his medications and needs a refill on his benacar Prescription sent to [facility] at [address]

## 2015-01-11 NOTE — Telephone Encounter (Signed)
Patient is calling to request a med refill for BENICAR 40 MG tablet, patient uses [facility] on [address]. Please f/u

## 2015-01-14 ENCOUNTER — Other Ambulatory Visit: Payer: Self-pay | Admitting: Internal Medicine

## 2015-01-18 ENCOUNTER — Other Ambulatory Visit: Payer: Self-pay | Admitting: *Deleted

## 2015-01-18 MED ORDER — ALLOPURINOL 300 MG PO TABS: 300.0000 mg | ORAL_TABLET | Freq: Every day | ORAL | Status: DC

## 2015-01-25 ENCOUNTER — Encounter: Payer: Self-pay | Admitting: Internal Medicine

## 2015-01-25 ENCOUNTER — Ambulatory Visit: Payer: Medicare Other | Attending: Internal Medicine | Admitting: Internal Medicine

## 2015-01-25 VITALS — BP 142/80 | HR 83 | Temp 98.0°F | Resp 16 | Wt 225.8 lb

## 2015-01-25 DIAGNOSIS — Z8639 Personal history of other endocrine, nutritional and metabolic disease: Secondary | ICD-10-CM | POA: Diagnosis not present

## 2015-01-25 DIAGNOSIS — R7309 Other abnormal glucose: Secondary | ICD-10-CM

## 2015-01-25 DIAGNOSIS — I10 Essential (primary) hypertension: Secondary | ICD-10-CM

## 2015-01-25 DIAGNOSIS — E785 Hyperlipidemia, unspecified: Secondary | ICD-10-CM

## 2015-01-25 DIAGNOSIS — R7303 Prediabetes: Secondary | ICD-10-CM

## 2015-01-25 DIAGNOSIS — Z8739 Personal history of other diseases of the musculoskeletal system and connective tissue: Secondary | ICD-10-CM

## 2015-01-25 MED ORDER — COLCHICINE 0.6 MG PO TABS: 0.6000 mg | ORAL_TABLET | ORAL | Status: DC

## 2015-01-25 MED ORDER — ALLOPURINOL 300 MG PO TABS: 300.0000 mg | ORAL_TABLET | Freq: Every day | ORAL | Status: DC

## 2015-01-25 MED ORDER — ATORVASTATIN CALCIUM 10 MG PO TABS: 10.0000 mg | ORAL_TABLET | Freq: Every day | ORAL | Status: DC

## 2015-01-25 MED ORDER — OLMESARTAN MEDOXOMIL 40 MG PO TABS: ORAL_TABLET | ORAL | Status: DC

## 2015-01-25 NOTE — Patient Instructions (Signed)
Diabetes Mellitus and Food It is important for you to manage your blood sugar (glucose) level. Your blood glucose level can be greatly affected by what you eat. Eating healthier foods in the appropriate amounts throughout the day at about the same time each day will help you control your blood glucose level. It can also help slow or prevent worsening of your diabetes mellitus. Healthy eating may even help you improve the level of your blood pressure and reach or maintain a healthy weight.  HOW CAN FOOD AFFECT ME? Carbohydrates Carbohydrates affect your blood glucose level more than any other type of food. Your dietitian will help you determine how many carbohydrates to eat at each meal and teach you how to count carbohydrates. Counting carbohydrates is important to keep your blood glucose at a healthy level, especially if you are using insulin or taking certain medicines for diabetes mellitus. Alcohol Alcohol can cause sudden decreases in blood glucose (hypoglycemia), especially if you use insulin or take certain medicines for diabetes mellitus. Hypoglycemia can be a life-threatening condition. Symptoms of hypoglycemia (sleepiness, dizziness, and disorientation) are similar to symptoms of having too much alcohol.  If your health care provider has given you approval to drink alcohol, do so in moderation and use the following guidelines:  Women should not have more than one drink per day, and men should not have more than two drinks per day. One drink is equal to:  12 oz of beer.  5 oz of wine.  1 oz of hard liquor.  Do not drink on an empty stomach.  Keep yourself hydrated. Have water, diet soda, or unsweetened iced tea.  Regular soda, juice, and other mixers might contain a lot of carbohydrates and should be counted. WHAT FOODS ARE NOT RECOMMENDED? As you make food choices, it is important to remember that all foods are not the same. Some foods have fewer nutrients per serving than other  foods, even though they might have the same number of calories or carbohydrates. It is difficult to get your body what it needs when you eat foods with fewer nutrients. Examples of foods that you should avoid that are high in calories and carbohydrates but low in nutrients include:  Trans fats (most processed foods list trans fats on the Nutrition Facts label).  Regular soda.  Juice.  Candy.  Sweets, such as cake, pie, doughnuts, and cookies.  Fried foods. WHAT FOODS CAN I EAT? Have nutrient-rich foods, which will nourish your body and keep you healthy. The food you should eat also will depend on several factors, including:  The calories you need.  The medicines you take.  Your weight.  Your blood glucose level.  Your blood pressure level.  Your cholesterol level. You also should eat a variety of foods, including:  Protein, such as meat, poultry, fish, tofu, nuts, and seeds (lean animal proteins are best).  Fruits.  Vegetables.  Dairy products, such as milk, cheese, and yogurt (low fat is best).  Breads, grains, pasta, cereal, rice, and beans.  Fats such as olive oil, trans fat-free margarine, canola oil, avocado, and olives. DOES EVERYONE WITH DIABETES MELLITUS HAVE THE SAME MEAL PLAN? Because every person with diabetes mellitus is different, there is not one meal plan that works for everyone. It is very important that you meet with a dietitian who will help you create a meal plan that is just right for you. Document Released: 04/26/2005 Document Revised: 08/04/2013 Document Reviewed: 06/26/2013 [employer] Patient Information 2015 [employer]. This   information is not intended to replace advice given to you by your health care provider. Make sure you discuss any questions you have with your health care provider. DASH Eating Plan DASH stands for "Dietary Approaches to Stop Hypertension." The DASH eating plan is a healthy eating plan that has been shown to reduce high  blood pressure (hypertension). Additional health benefits may include reducing the risk of type 2 diabetes mellitus, heart disease, and stroke. The DASH eating plan may also help with weight loss. WHAT DO I NEED TO KNOW ABOUT THE DASH EATING PLAN? For the DASH eating plan, you will follow these general guidelines:  Choose foods with a percent daily value for sodium of less than 5% (as listed on the food label).  Use salt-free seasonings or herbs instead of table salt or sea salt.  Check with your health care provider or pharmacist before using salt substitutes.  Eat lower-sodium products, often labeled as "lower sodium" or "no salt added."  Eat fresh foods.  Eat more vegetables, fruits, and low-fat dairy products.  Choose whole grains. Look for the word "whole" as the first word in the ingredient list.  Choose fish and skinless chicken or turkey more often than red meat. Limit fish, poultry, and meat to 6 oz (170 g) each day.  Limit sweets, desserts, sugars, and sugary drinks.  Choose heart-healthy fats.  Limit cheese to 1 oz (28 g) per day.  Eat more home-cooked food and less restaurant, buffet, and fast food.  Limit fried foods.  Cook foods using methods other than frying.  Limit canned vegetables. If you do use them, rinse them well to decrease the sodium.  When eating at a restaurant, ask that your food be prepared with less salt, or no salt if possible. WHAT FOODS CAN I EAT? Seek help from a dietitian for individual calorie needs. Grains Whole grain or whole wheat bread. Brown rice. Whole grain or whole wheat pasta. Quinoa, bulgur, and whole grain cereals. Low-sodium cereals. Corn or whole wheat flour tortillas. Whole grain cornbread. Whole grain crackers. Low-sodium crackers. Vegetables Fresh or frozen vegetables (raw, steamed, roasted, or grilled). Low-sodium or reduced-sodium tomato and vegetable juices. Low-sodium or reduced-sodium tomato sauce and paste. Low-sodium  or reduced-sodium canned vegetables.  Fruits All fresh, canned (in natural juice), or frozen fruits. Meat and Other Protein Products Ground beef (85% or leaner), grass-fed beef, or beef trimmed of fat. Skinless chicken or turkey. Ground chicken or turkey. Pork trimmed of fat. All fish and seafood. Eggs. Dried beans, peas, or lentils. Unsalted nuts and seeds. Unsalted canned beans. Dairy Low-fat dairy products, such as skim or 1% milk, 2% or reduced-fat cheeses, low-fat ricotta or cottage cheese, or plain low-fat yogurt. Low-sodium or reduced-sodium cheeses. Fats and Oils Tub margarines without trans fats. Light or reduced-fat mayonnaise and salad dressings (reduced sodium). Avocado. Safflower, olive, or canola oils. Natural peanut or almond butter. Other Unsalted popcorn and pretzels. The items listed above may not be a complete list of recommended foods or beverages. Contact your dietitian for more options. WHAT FOODS ARE NOT RECOMMENDED? Grains White bread. White pasta. White rice. Refined cornbread. Bagels and croissants. Crackers that contain trans fat. Vegetables Creamed or fried vegetables. Vegetables in a cheese sauce. Regular canned vegetables. Regular canned tomato sauce and paste. Regular tomato and vegetable juices. Fruits Dried fruits. Canned fruit in light or heavy syrup. Fruit juice. Meat and Other Protein Products Fatty cuts of meat. Ribs, chicken wings, bacon, sausage, bologna, salami, chitterlings, fatback, hot   dogs, bratwurst, and packaged luncheon meats. Salted nuts and seeds. Canned beans with salt. Dairy Whole or 2% milk, cream, half-and-half, and cream cheese. Whole-fat or sweetened yogurt. Full-fat cheeses or blue cheese. Nondairy creamers and whipped toppings. Processed cheese, cheese spreads, or cheese curds. Condiments Onion and garlic salt, seasoned salt, table salt, and sea salt. Canned and packaged gravies. Worcestershire sauce. Tartar sauce. Barbecue sauce.  Teriyaki sauce. Soy sauce, including reduced sodium. Steak sauce. Fish sauce. Oyster sauce. Cocktail sauce. Horseradish. Ketchup and mustard. Meat flavorings and tenderizers. Bouillon cubes. Hot sauce. Tabasco sauce. Marinades. Taco seasonings. Relishes. Fats and Oils Butter, stick margarine, lard, shortening, ghee, and bacon fat. Coconut, palm kernel, or palm oils. Regular salad dressings. Other Pickles and olives. Salted popcorn and pretzels. The items listed above may not be a complete list of foods and beverages to avoid. Contact your dietitian for more information. WHERE CAN I FIND MORE INFORMATION? National Heart, Lung, and [employer] Institute: www.nhlbi.nih.gov/health/health-topics/topics/dash/ Document Released: 07/19/2011 Document Revised: 12/14/2013 Document Reviewed: 06/03/2013 [employer] Patient Information 2015 [employer]. This information is not intended to replace advice given to you by your health care provider. Make sure you discuss any questions you have with your health care provider.  

## 2015-01-25 NOTE — Progress Notes (Signed)
Patient here for his routine three month follow up Patient also requesting his refills be at three month intervals

## 2015-01-25 NOTE — Progress Notes (Signed)
MRN: [id number] Name: Marcquis Ridlon  Sex: male Age: 74 y.o. DOB: 1941/06/19  Allergies: Review of patient's allergies indicates no known allergies.  Chief Complaint  Patient presents with  . Follow-up    HPI: Patient is 74 y.o. male who history of hypertension hyperlipidemia, prediabetes, history of gout comes today for followup  and requesting refill on his medications, denies any acute symptoms denies any headache dizziness chest and shortness of breath today his blood pressure is borderline elevated.patient occasionally has cough, denies fever chills,patient denies smoking cigarettes.  Past Medical History  Diagnosis Date  . Hypertension   . Hypercholesterolemia     Past Surgical History  Procedure Laterality Date  . Colonoscopy      2015  due in 2017      Medication List       This list is accurate as of: 01/25/15  3:10 PM.  Always use your most recent med list.               albuterol 108 (90 BASE) MCG/ACT inhaler  Commonly known as:  PROVENTIL HFA;VENTOLIN HFA  Inhale 2 puffs into the lungs every 6 (six) hours as needed for wheezing or shortness of breath.     alfuzosin 10 MG 24 hr tablet  Commonly known as:  UROXATRAL  Take 10 mg by mouth daily.     allopurinol 300 MG tablet  Commonly known as:  ZYLOPRIM  Take 1 tablet (300 mg total) by mouth daily.     atorvastatin 10 MG tablet  Commonly known as:  LIPITOR  Take 1 tablet (10 mg total) by mouth daily.     azithromycin 250 MG tablet  Commonly known as:  ZITHROMAX Z-PAK  Take as directed     colchicine 0.6 MG tablet  Commonly known as:  COLCRYS  Take 1 tablet (0.6 mg total) by mouth every other day.     HYDROcodone-acetaminophen 5-325 MG per tablet  Commonly known as:  NORCO/VICODIN  Take 1/2-1 tablets every 6 hours as needed for severe pain.     olmesartan 40 MG tablet  Commonly known as:  BENICAR  TAKE ONE TABLET BY MOUTH  DAILY     tadalafil 5 MG tablet  Commonly known as:  CIALIS  Take  1 tablet (5 mg total) by mouth daily as needed for erectile dysfunction.     Vitamin D (Ergocalciferol) 50000 UNITS Caps capsule  Commonly known as:  DRISDOL  Take 1 capsule (50,000 Units total) by mouth every 7 (seven) days.        Meds ordered this encounter  Medications  . allopurinol (ZYLOPRIM) 300 MG tablet    Sig: Take 1 tablet (300 mg total) by mouth daily.    Dispense:  90 tablet    Refill:  3  . atorvastatin (LIPITOR) 10 MG tablet    Sig: Take 1 tablet (10 mg total) by mouth daily.    Dispense:  90 tablet    Refill:  2  . colchicine (COLCRYS) 0.6 MG tablet    Sig: Take 1 tablet (0.6 mg total) by mouth every other day.    Dispense:  90 tablet    Refill:  2  . olmesartan (BENICAR) 40 MG tablet    Sig: TAKE ONE TABLET BY MOUTH  DAILY    Dispense:  90 tablet    Refill:  1    Immunization History  Administered Date(s) Administered  . Influenza-Unspecified 05/03/2014    Family History  Problem Relation  Age of Onset  . Cancer Mother   . Heart disease Father   . Hyperlipidemia Father   . Hypertension Father   . Heart attack Father     History  Substance Use Topics  . Smoking status: Former Smoker -- 25 years    Types: Cigarettes    Quit date: 09/18/2012  . Smokeless tobacco: Never Used  . Alcohol Use: No    Review of Systems   As noted in HPI  Filed Vitals:   01/25/15 1411  BP: 142/80  Pulse: 83  Temp: 98 F (36.7 C)  Resp: 16    Physical Exam  Physical Exam  Constitutional: No distress.  Eyes: EOM are normal. Pupils are equal, round, and reactive to light.  Cardiovascular: Normal rate and regular rhythm.   Pulmonary/Chest: Breath sounds normal. No respiratory distress. He has no wheezes. He has no rales.  Musculoskeletal: He exhibits no edema.    CBC    Component Value Date/Time   WBC 5.9 07/19/2014 0946   RBC 4.47 07/19/2014 0946   HGB 12.7* 07/19/2014 0946   HCT 35.6* 07/19/2014 0946   PLT 240 07/19/2014 0946   MCV 79.6  07/19/2014 0946   LYMPHSABS 2.9 07/19/2014 0946   MONOABS 0.6 07/19/2014 0946   EOSABS 0.2 07/19/2014 0946   BASOSABS 0.0 07/19/2014 0946    CMP     Component Value Date/Time   NA 140 10/19/2014 1021   K 4.1 10/19/2014 1021   CL 104 10/19/2014 1021   CO2 26 10/19/2014 1021   GLUCOSE 86 10/19/2014 1021   BUN 18 10/19/2014 1021   CREATININE 1.12 10/19/2014 1021   CREATININE 1.32 12/16/2008 2200   CALCIUM 8.9 10/19/2014 1021   PROT 6.5 10/19/2014 1021   ALBUMIN 3.9 10/19/2014 1021   AST 28 10/19/2014 1021   ALT 26 10/19/2014 1021   ALKPHOS 54 10/19/2014 1021   BILITOT 0.9 10/19/2014 1021   GFRNONAA 64 10/19/2014 1021   GFRNONAA 54* 12/16/2008 2200   GFRAA 74 10/19/2014 1021   GFRAA  12/16/2008 2200    >60        The eGFR has been calculated using the MDRD equation. This calculation has not been validated in all clinical situations. eGFR's persistently <60 mL/min signify possible Chronic Kidney Disease.    Lab Results  Component Value Date/Time   CHOL 156 07/19/2014 09:46 AM    Lab Results  Component Value Date/Time   HGBA1C 6.0* 07/19/2014 09:46 AM    Lab Results  Component Value Date/Time   AST 28 10/19/2014 10:21 AM    Assessment and Plan  History of gout - Plan: allopurinol (ZYLOPRIM) 300 MG tablet, colchicine (COLCRYS) 0.6 MG tablet  Essential hypertension - Plan: advised patient for DASH diet, continue with olmesartan (BENICAR) 40 MG tablet  Hyperlipidemia - Plan: currently patient is on atorvastatin (LIPITOR) 10 MG tablet, we'll check fasting lipid panel on the next visit  Prediabetes Last hemoglobin A1c was 6.2%, is advised for low carbohydrate diet, recheck A1c  on the following visit    Return in about 3 months (around 04/27/2015), or if symptoms worsen or fail to improve.   This note has been created with Surveyor, quantity. Any transcriptional errors are unintentional.    Lorayne Marek,  MD

## 2015-02-21 NOTE — Telephone Encounter (Signed)
Med have been called in

## 2015-02-24 ENCOUNTER — Telehealth: Payer: Self-pay | Admitting: Internal Medicine

## 2015-02-24 ENCOUNTER — Other Ambulatory Visit: Payer: Self-pay

## 2015-02-24 MED ORDER — ALFUZOSIN HCL ER 10 MG PO TB24: 10.0000 mg | ORAL_TABLET | Freq: Every day | ORAL | Status: DC

## 2015-02-24 NOTE — Telephone Encounter (Signed)
Patient called requesting medication refill on alfuzosin (UROXATRAL) 10 MG 24 hr tablet.  Patient states he is completely out of medication. Please f/u  Patient uses [place]

## 2015-02-24 NOTE — Progress Notes (Unsigned)
Patient came into office requesting a refill on his uroxatral Per Dr Doreene Burke we can refill  [facility]

## 2015-03-28 ENCOUNTER — Other Ambulatory Visit: Payer: Self-pay | Admitting: Internal Medicine

## 2015-03-30 ENCOUNTER — Other Ambulatory Visit: Payer: Self-pay

## 2015-03-30 DIAGNOSIS — N400 Enlarged prostate without lower urinary tract symptoms: Secondary | ICD-10-CM

## 2015-03-30 MED ORDER — ALFUZOSIN HCL ER 10 MG PO TB24: 10.0000 mg | ORAL_TABLET | Freq: Every day | ORAL | Status: DC

## 2015-07-15 ENCOUNTER — Other Ambulatory Visit: Payer: Self-pay | Admitting: Family Medicine

## 2015-07-15 DIAGNOSIS — I10 Essential (primary) hypertension: Secondary | ICD-10-CM

## 2015-07-15 MED ORDER — OLMESARTAN MEDOXOMIL 40 MG PO TABS: ORAL_TABLET | ORAL | Status: DC

## 2015-07-25 ENCOUNTER — Other Ambulatory Visit: Payer: Self-pay | Admitting: Internal Medicine

## 2015-07-27 NOTE — Telephone Encounter (Signed)
Patient called and requested a med refill for Uroxatral $RemoveBefor'10mg'[id number]$ . Please f/u

## 2015-07-27 NOTE — Telephone Encounter (Signed)
Pt. Came in requesting a med refill on alfuzosin (UROXATRAL) 10 MG 24 hr tablet. Pt. Stated he is out of his medication.Please f/u with pt.

## 2015-08-01 ENCOUNTER — Other Ambulatory Visit: Payer: Self-pay | Admitting: Internal Medicine

## 2015-08-03 NOTE — Telephone Encounter (Signed)
alfuzosin (UROXATRAL) 10 MG 24 hr tablet  Fax said- Notes to prescriber: 12/21. Please follow up for further questions.

## 2015-08-11 ENCOUNTER — Emergency Department (HOSPITAL_COMMUNITY): Payer: Medicare Other

## 2015-08-11 ENCOUNTER — Encounter (HOSPITAL_COMMUNITY): Payer: Self-pay | Admitting: *Deleted

## 2015-08-11 ENCOUNTER — Emergency Department (HOSPITAL_COMMUNITY)
Admission: EM | Admit: 2015-08-11 | Discharge: 2015-08-11 | Disposition: A | Discharge status: Home / Self Care | Payer: Medicare Other | Attending: Emergency Medicine | Admitting: Emergency Medicine

## 2015-08-11 DIAGNOSIS — Z87891 Personal history of nicotine dependence: Secondary | ICD-10-CM | POA: Diagnosis not present

## 2015-08-11 DIAGNOSIS — R05 Cough: Secondary | ICD-10-CM | POA: Diagnosis present

## 2015-08-11 DIAGNOSIS — I10 Essential (primary) hypertension: Secondary | ICD-10-CM | POA: Insufficient documentation

## 2015-08-11 DIAGNOSIS — J40 Bronchitis, not specified as acute or chronic: Secondary | ICD-10-CM

## 2015-08-11 DIAGNOSIS — R042 Hemoptysis: Secondary | ICD-10-CM | POA: Diagnosis not present

## 2015-08-11 DIAGNOSIS — E7800 Pure hypercholesterolemia, unspecified: Secondary | ICD-10-CM | POA: Insufficient documentation

## 2015-08-11 DIAGNOSIS — J209 Acute bronchitis, unspecified: Secondary | ICD-10-CM | POA: Diagnosis not present

## 2015-08-11 DIAGNOSIS — Z79899 Other long term (current) drug therapy: Secondary | ICD-10-CM | POA: Diagnosis not present

## 2015-08-11 LAB — CBC WITH DIFFERENTIAL/PLATELET
Basophils Absolute: 0 10*3/uL (ref 0.0–0.1)
Basophils Relative: 1 %
EOS PCT: 1 %
Eosinophils Absolute: 0.1 10*3/uL (ref 0.0–0.7)
HEMATOCRIT: 38.8 % — AB (ref 39.0–52.0)
Hemoglobin: 13.8 g/dL (ref 13.0–17.0)
LYMPHS PCT: 28 %
Lymphs Abs: 2.2 10*3/uL (ref 0.7–4.0)
MCH: 29.2 pg (ref 26.0–34.0)
MCHC: 35.6 g/dL (ref 30.0–36.0)
MCV: 82 fL (ref 78.0–100.0)
MONO ABS: 0.8 10*3/uL (ref 0.1–1.0)
MONOS PCT: 10 %
NEUTROS ABS: 5 10*3/uL (ref 1.7–7.7)
Neutrophils Relative %: 61 %
PLATELETS: 171 10*3/uL (ref 150–400)
RBC: 4.73 MIL/uL (ref 4.22–5.81)
RDW: 15.2 % (ref 11.5–15.5)
WBC: 8.1 10*3/uL (ref 4.0–10.5)

## 2015-08-11 LAB — COMPREHENSIVE METABOLIC PANEL
ALT: 27 U/L (ref 17–63)
ANION GAP: 10 (ref 5–15)
AST: 40 U/L (ref 15–41)
Albumin: 3.8 g/dL (ref 3.5–5.0)
Alkaline Phosphatase: 78 U/L (ref 38–126)
BILIRUBIN TOTAL: 0.8 mg/dL (ref 0.3–1.2)
BUN: 15 mg/dL (ref 6–20)
CHLORIDE: 105 mmol/L (ref 101–111)
CO2: 26 mmol/L (ref 22–32)
Calcium: 9.2 mg/dL (ref 8.9–10.3)
Creatinine, Ser: 1.4 mg/dL — ABNORMAL HIGH (ref 0.61–1.24)
GFR, EST AFRICAN AMERICAN: 55 mL/min — AB (ref >60)
GFR, EST NON AFRICAN AMERICAN: 47 mL/min — AB (ref >60)
Glucose, Bld: 93 mg/dL (ref 65–99)
POTASSIUM: 3.5 mmol/L (ref 3.5–5.1)
Sodium: 141 mmol/L (ref 135–145)
TOTAL PROTEIN: 7 g/dL (ref 6.5–8.1)

## 2015-08-11 LAB — I-STAT TROPONIN, ED: TROPONIN I, POC: 0.01 ng/mL (ref 0.00–0.08)

## 2015-08-11 LAB — I-STAT CHEM 8, ED
BUN: 17 mg/dL (ref 6–20)
CREATININE: 1.3 mg/dL — AB (ref 0.61–1.24)
Calcium, Ion: 1.11 mmol/L — ABNORMAL LOW (ref 1.13–1.30)
Chloride: 105 mmol/L (ref 101–111)
Glucose, Bld: 87 mg/dL (ref 65–99)
HEMATOCRIT: 45 % (ref 39.0–52.0)
HEMOGLOBIN: 15.3 g/dL (ref 13.0–17.0)
Potassium: 3.5 mmol/L (ref 3.5–5.1)
SODIUM: 142 mmol/L (ref 135–145)
TCO2: 26 mmol/L (ref 0–100)

## 2015-08-11 MED ORDER — ALBUTEROL SULFATE (2.5 MG/3ML) 0.083% IN NEBU
5.0000 mg | INHALATION_SOLUTION | Freq: Once | RESPIRATORY_TRACT | Status: AC
  Administered 2015-08-11: 5 mg via RESPIRATORY_TRACT

## 2015-08-11 MED ORDER — ALBUTEROL SULFATE (2.5 MG/3ML) 0.083% IN NEBU
INHALATION_SOLUTION | RESPIRATORY_TRACT | Status: AC
  Filled 2015-08-11: qty 6

## 2015-08-11 MED ORDER — IPRATROPIUM-ALBUTEROL 0.5-2.5 (3) MG/3ML IN SOLN
3.0000 mL | Freq: Once | RESPIRATORY_TRACT | Status: AC
  Administered 2015-08-11: 3 mL via RESPIRATORY_TRACT
  Filled 2015-08-11: qty 3

## 2015-08-11 MED ORDER — PREDNISONE 20 MG PO TABS
60.0000 mg | ORAL_TABLET | Freq: Once | ORAL | Status: AC
  Administered 2015-08-11: 60 mg via ORAL
  Filled 2015-08-11: qty 3

## 2015-08-11 MED ORDER — PREDNISONE 20 MG PO TABS: ORAL_TABLET | ORAL | Status: DC

## 2015-08-11 NOTE — Discharge Instructions (Signed)
Take prednisone as prescribed.   Use albuterol every 6 hrs as needed at home.   See your doctor.   Return to ER if you have worse cough, wheezing, trouble breathing, blood in sputum.

## 2015-08-11 NOTE — ED Provider Notes (Signed)
CSN: [id number]     Arrival date & time 08/11/15  1644 History   First MD Initiated Contact with Patient 08/11/15 2238     Chief Complaint  Patient presents with  . Cough  . Hemoptysis     (Consider location/radiation/quality/duration/timing/severity/associated sxs/prior Treatment) The history is provided by the patient.  Gary Pierce is a 74 y.o. male hx of HTN, HL, former smoker here with cough. She states that he has been coughing for the last 3 days. Initially he was just coughing up clear sputum but since yesterday he has several episodes of blood-tinged sputum. Patient states that he was a former smoker but was never diagnosed with COPD. Patient denies any history of CAD. No hx of PE or TB.    Past Medical History  Diagnosis Date  . Hypertension   . Hypercholesterolemia    Past Surgical History  Procedure Laterality Date  . Colonoscopy      2015  due in 2017   Family History  Problem Relation Age of Onset  . Cancer Mother   . Heart disease Father   . Hyperlipidemia Father   . Hypertension Father   . Heart attack Father    Social History  Substance Use Topics  . Smoking status: Former Smoker -- 25 years    Types: Cigarettes    Quit date: 09/18/2012  . Smokeless tobacco: Never Used  . Alcohol Use: No    Review of Systems  Respiratory: Positive for cough.   All other systems reviewed and are negative.     Allergies  Review of patient's allergies indicates no known allergies.  Home Medications   Prior to Admission medications   Medication Sig Start Date End Date Taking? Authorizing Provider  albuterol (PROVENTIL HFA;VENTOLIN HFA) 108 (90 BASE) MCG/ACT inhaler Inhale 2 puffs into the lungs every 6 (six) hours as needed for wheezing or shortness of breath. 08/02/14  Yes Lorayne Marek, MD  alfuzosin (UROXATRAL) 10 MG 24 hr tablet Take 1 tablet (10 mg total) by mouth daily. 03/30/15  Yes Tresa Garter, MD  allopurinol (ZYLOPRIM) 300 MG tablet Take 1 tablet  (300 mg total) by mouth daily. 01/25/15  Yes Lorayne Marek, MD  atorvastatin (LIPITOR) 10 MG tablet Take 1 tablet (10 mg total) by mouth daily. 01/25/15  Yes Lorayne Marek, MD  colchicine (COLCRYS) 0.6 MG tablet Take 1 tablet (0.6 mg total) by mouth every other day. 01/25/15  Yes Lorayne Marek, MD  olmesartan (BENICAR) 40 MG tablet TAKE ONE TABLET BY MOUTH  DAILY 07/15/15  Yes Josalyn Funches, MD  azithromycin (ZITHROMAX Z-PAK) 250 MG tablet Take as directed Patient not taking: Reported on 08/11/2015 10/19/14   Lorayne Marek, MD  HYDROcodone-acetaminophen (NORCO/VICODIN) 5-325 MG per tablet Take 1/2-1 tablets every 6 hours as needed for severe pain. Patient not taking: Reported on 08/11/2015 04/15/14   Carlisle Cater, PA-C  tadalafil (CIALIS) 5 MG tablet Take 1 tablet (5 mg total) by mouth daily as needed for erectile dysfunction. Patient not taking: Reported on 08/11/2015 07/19/14   Lorayne Marek, MD  Vitamin D, Ergocalciferol, (DRISDOL) 50000 UNITS CAPS capsule Take 1 capsule (50,000 Units total) by mouth every 7 (seven) days. Patient not taking: Reported on 08/02/2014 07/20/14   Lorayne Marek, MD   BP 160/89 mmHg  Pulse 77  Temp(Src) 98.3 F (36.8 C) (Oral)  Resp 18  SpO2 98% Physical Exam  Constitutional: He is oriented to person, place, and time. He appears well-developed and well-nourished.  HENT:  Head: Normocephalic.  Mouth/Throat: Oropharynx is clear and moist.  Eyes: Conjunctivae are normal. Pupils are equal, round, and reactive to light.  Neck: Normal range of motion. Neck supple.  Cardiovascular: Normal rate, regular rhythm and normal heart sounds.   Pulmonary/Chest: Effort normal.  Minimal wheezing throughout, no retractions   Abdominal: Soft. Bowel sounds are normal. He exhibits no distension. There is no tenderness. There is no rebound.  Musculoskeletal: Normal range of motion. He exhibits no edema or tenderness.  Neurological: He is alert and oriented to person, place, and time.  No cranial nerve deficit. Coordination normal.  Skin: Skin is warm and dry.  Psychiatric: He has a normal mood and affect. His behavior is normal. Judgment and thought content normal.  Nursing note and vitals reviewed.   ED Course  Procedures (including critical care time) Labs Review Labs Reviewed  CBC WITH DIFFERENTIAL/PLATELET - Abnormal; Notable for the following:    HCT 38.8 (*)    All other components within normal limits  COMPREHENSIVE METABOLIC PANEL - Abnormal; Notable for the following:    Creatinine, Ser 1.40 (*)    GFR calc non Af Amer 47 (*)    GFR calc Af Amer 55 (*)    All other components within normal limits  I-STAT CHEM 8, ED - Abnormal; Notable for the following:    Creatinine, Ser 1.30 (*)    Calcium, Ion 1.11 (*)    All other components within normal limits  I-STAT TROPOININ, ED    Imaging Review Dg Chest 2 View  08/11/2015  CLINICAL DATA:  Chest soreness with cough for 4 days.  Congestion. EXAM: CHEST  2 VIEW COMPARISON:  October 19, 2014 FINDINGS: Lungs are clear. Heart size and pulmonary vascularity are normal. No adenopathy. There is atherosclerotic calcification in the aortic arch region. No bone lesions. No pneumothorax. IMPRESSION: No edema or consolidation. Electronically Signed   By: Lowella Grip III M.D.   On: 08/11/2015 19:03   I have personally reviewed and evaluated these images and lab results as part of my medical decision-making.   EKG Interpretation None      MDM   Final diagnoses:  None    Gary Pierce is a 74 y.o. male here with cough with blood tinged sputum. I think likely from bronchitis. Not tachy to suggest PE. Afebrile, I doubt pneumonia. Will get CBC, CXR. Will give nebs, steroids.   11:20 PM Felt better after 2 nebs, steroids. Minimal wheezing now. CXR clear. Likely mild COPD exacerbation causing blood tinged sputum. CBC stable. Vitals stable. Will dc home with steroids. He has albuterol at home.   Wandra Arthurs,  MD 08/11/15 [phone]

## 2015-08-11 NOTE — ED Notes (Signed)
Yao, MD at bedside.  

## 2015-08-11 NOTE — ED Notes (Addendum)
Pt reports having productive cough that started on Tuesday, having blood tinged sputum since yesterday. Denies fever. Airway intact at triage, able to speak in full sentences, exp wheezing noted.

## 2015-08-15 ENCOUNTER — Inpatient Hospital Stay (HOSPITAL_COMMUNITY): Payer: Medicare Other

## 2015-08-15 ENCOUNTER — Observation Stay (HOSPITAL_COMMUNITY)
Admission: AD | Admit: 2015-08-15 | Discharge: 2015-08-17 | Disposition: A | Discharge status: Home / Self Care | Payer: Medicare Other | Source: Ambulatory Visit | Attending: Cardiovascular Disease | Admitting: Cardiovascular Disease

## 2015-08-15 DIAGNOSIS — Z79899 Other long term (current) drug therapy: Secondary | ICD-10-CM | POA: Insufficient documentation

## 2015-08-15 DIAGNOSIS — J45901 Unspecified asthma with (acute) exacerbation: Principal | ICD-10-CM | POA: Insufficient documentation

## 2015-08-15 DIAGNOSIS — R0609 Other forms of dyspnea: Secondary | ICD-10-CM | POA: Diagnosis present

## 2015-08-15 DIAGNOSIS — E785 Hyperlipidemia, unspecified: Secondary | ICD-10-CM | POA: Diagnosis present

## 2015-08-15 DIAGNOSIS — E7800 Pure hypercholesterolemia, unspecified: Secondary | ICD-10-CM | POA: Diagnosis not present

## 2015-08-15 DIAGNOSIS — I10 Essential (primary) hypertension: Secondary | ICD-10-CM | POA: Diagnosis not present

## 2015-08-15 DIAGNOSIS — Z87891 Personal history of nicotine dependence: Secondary | ICD-10-CM | POA: Insufficient documentation

## 2015-08-15 DIAGNOSIS — E782 Mixed hyperlipidemia: Secondary | ICD-10-CM | POA: Diagnosis present

## 2015-08-15 DIAGNOSIS — R0602 Shortness of breath: Secondary | ICD-10-CM | POA: Diagnosis present

## 2015-08-15 LAB — CBC WITH DIFFERENTIAL/PLATELET
BASOS ABS: 0 10*3/uL (ref 0.0–0.1)
BASOS PCT: 0 %
Eosinophils Absolute: 0 10*3/uL (ref 0.0–0.7)
Eosinophils Relative: 0 %
HEMATOCRIT: 34.9 % — AB (ref 39.0–52.0)
HEMOGLOBIN: 12.2 g/dL — AB (ref 13.0–17.0)
Lymphocytes Relative: 22 %
Lymphs Abs: 1.6 10*3/uL (ref 0.7–4.0)
MCH: 28.6 pg (ref 26.0–34.0)
MCHC: 35 g/dL (ref 30.0–36.0)
MCV: 81.9 fL (ref 78.0–100.0)
Monocytes Absolute: 0.2 10*3/uL (ref 0.1–1.0)
Monocytes Relative: 3 %
NEUTROS ABS: 5.5 10*3/uL (ref 1.7–7.7)
NEUTROS PCT: 75 %
Platelets: 162 10*3/uL (ref 150–400)
RBC: 4.26 MIL/uL (ref 4.22–5.81)
RDW: 15.3 % (ref 11.5–15.5)
WBC: 7.4 10*3/uL (ref 4.0–10.5)

## 2015-08-15 LAB — COMPREHENSIVE METABOLIC PANEL
ALK PHOS: 66 U/L (ref 38–126)
ALT: 43 U/L (ref 17–63)
ANION GAP: 10 (ref 5–15)
AST: 46 U/L — ABNORMAL HIGH (ref 15–41)
Albumin: 3.1 g/dL — ABNORMAL LOW (ref 3.5–5.0)
BILIRUBIN TOTAL: 0.6 mg/dL (ref 0.3–1.2)
BUN: 17 mg/dL (ref 6–20)
CALCIUM: 8.8 mg/dL — AB (ref 8.9–10.3)
CO2: 23 mmol/L (ref 22–32)
Chloride: 108 mmol/L (ref 101–111)
Creatinine, Ser: 1.24 mg/dL (ref 0.61–1.24)
GFR, EST NON AFRICAN AMERICAN: 55 mL/min — AB (ref >60)
Glucose, Bld: 149 mg/dL — ABNORMAL HIGH (ref 65–99)
Potassium: 4.3 mmol/L (ref 3.5–5.1)
SODIUM: 141 mmol/L (ref 135–145)
TOTAL PROTEIN: 6.3 g/dL — AB (ref 6.5–8.1)

## 2015-08-15 MED ORDER — DEXTROSE 5 % IV SOLN
500.0000 mg | INTRAVENOUS | Status: DC
  Administered 2015-08-15 – 2015-08-16 (×2): 500 mg via INTRAVENOUS
  Filled 2015-08-15 (×3): qty 500

## 2015-08-15 MED ORDER — ALFUZOSIN HCL ER 10 MG PO TB24
10.0000 mg | ORAL_TABLET | Freq: Every day | ORAL | Status: DC
  Administered 2015-08-16 – 2015-08-17 (×2): 10 mg via ORAL
  Filled 2015-08-15 (×2): qty 1

## 2015-08-15 MED ORDER — ALLOPURINOL 300 MG PO TABS
300.0000 mg | ORAL_TABLET | Freq: Every day | ORAL | Status: DC
  Administered 2015-08-16 – 2015-08-17 (×2): 300 mg via ORAL
  Filled 2015-08-15 (×2): qty 1

## 2015-08-15 MED ORDER — ZOLPIDEM TARTRATE 5 MG PO TABS
5.0000 mg | ORAL_TABLET | Freq: Every evening | ORAL | Status: DC | PRN
  Administered 2015-08-15 – 2015-08-16 (×2): 5 mg via ORAL
  Filled 2015-08-15 (×2): qty 1

## 2015-08-15 MED ORDER — SODIUM CHLORIDE 0.9 % IJ SOLN
3.0000 mL | Freq: Two times a day (BID) | INTRAMUSCULAR | Status: DC
  Administered 2015-08-15 – 2015-08-17 (×4): 3 mL via INTRAVENOUS

## 2015-08-15 MED ORDER — IRBESARTAN 150 MG PO TABS
75.0000 mg | ORAL_TABLET | Freq: Every day | ORAL | Status: DC
  Administered 2015-08-16 – 2015-08-17 (×2): 75 mg via ORAL
  Filled 2015-08-15 (×2): qty 1

## 2015-08-15 MED ORDER — DEXTROSE 5 % IV SOLN
1.0000 g | INTRAVENOUS | Status: DC
  Administered 2015-08-15 – 2015-08-17 (×3): 1 g via INTRAVENOUS
  Filled 2015-08-15 (×3): qty 10

## 2015-08-15 MED ORDER — ATORVASTATIN CALCIUM 10 MG PO TABS
10.0000 mg | ORAL_TABLET | Freq: Every day | ORAL | Status: DC
  Administered 2015-08-16: 10 mg via ORAL
  Filled 2015-08-15: qty 1

## 2015-08-15 MED ORDER — ALBUTEROL SULFATE (2.5 MG/3ML) 0.083% IN NEBU
2.5000 mg | INHALATION_SOLUTION | Freq: Four times a day (QID) | RESPIRATORY_TRACT | Status: DC
  Administered 2015-08-15 – 2015-08-17 (×8): 2.5 mg via RESPIRATORY_TRACT
  Filled 2015-08-15 (×8): qty 3

## 2015-08-15 MED ORDER — COLCHICINE 0.6 MG PO TABS
0.6000 mg | ORAL_TABLET | ORAL | Status: DC
  Administered 2015-08-16: 0.6 mg via ORAL
  Filled 2015-08-15: qty 1

## 2015-08-15 MED ORDER — METHYLPREDNISOLONE SODIUM SUCC 125 MG IJ SOLR
60.0000 mg | Freq: Every day | INTRAMUSCULAR | Status: AC
Start: 2015-08-15 — End: 2015-08-16
  Administered 2015-08-15 – 2015-08-16 (×2): 60 mg via INTRAVENOUS
  Filled 2015-08-15 (×2): qty 2

## 2015-08-15 MED ORDER — ENOXAPARIN SODIUM 40 MG/0.4ML ~~LOC~~ SOLN
40.0000 mg | SUBCUTANEOUS | Status: DC
  Administered 2015-08-15 – 2015-08-17 (×3): 40 mg via SUBCUTANEOUS
  Filled 2015-08-15 (×3): qty 0.4

## 2015-08-15 NOTE — H&P (Signed)
Referring Physician:  Lauri Till is an 75 y.o. male.                       Chief Complaint: Shortness of breath  HPI: 75 y.o. male hx of HTN, HL, former smoker here with shortness of breath x 5 days. He states that he has been coughing and wheezing for the last 5 days. Initially he was just coughing up clear sputum but since Thursday he has several episodes of blood-tinged sputum. Patient states that he was a former smoker but was never diagnosed with COPD. Patient denies any history of CAD. No hx of PE or TB.   Past Medical History  Diagnosis Date  . Hypertension   . Hypercholesterolemia       Past Surgical History  Procedure Laterality Date  . Colonoscopy      2015  due in 2017    Family History  Problem Relation Age of Onset  . Cancer Mother   . Heart disease Father   . Hyperlipidemia Father   . Hypertension Father   . Heart attack Father    Social History:  reports that he quit smoking about 2 years ago. His smoking use included Cigarettes. He quit after 25 years of use. He has never used smokeless tobacco. He reports that he does not drink alcohol or use illicit drugs.  Allergies: No Known Allergies  Medications Prior to Admission  Medication Sig Dispense Refill  . albuterol (PROVENTIL HFA;VENTOLIN HFA) 108 (90 BASE) MCG/ACT inhaler Inhale 2 puffs into the lungs every 6 (six) hours as needed for wheezing or shortness of breath. 1 Inhaler 2  . alfuzosin (UROXATRAL) 10 MG 24 hr tablet Take 1 tablet (10 mg total) by mouth daily. Needs office visit for more refills 30 tablet 1  . allopurinol (ZYLOPRIM) 300 MG tablet Take 1 tablet (300 mg total) by mouth daily. 90 tablet 3  . atorvastatin (LIPITOR) 10 MG tablet Take 1 tablet (10 mg total) by mouth daily. 90 tablet 2  . colchicine (COLCRYS) 0.6 MG tablet Take 1 tablet (0.6 mg total) by mouth every other day. 90 tablet 2  . olmesartan (BENICAR) 40 MG tablet TAKE ONE TABLET BY MOUTH  DAILY 30 tablet 0  . tadalafil (CIALIS) 5 MG  tablet Take 1 tablet (5 mg total) by mouth daily as needed for erectile dysfunction. (Patient not taking: Reported on 08/11/2015) 30 tablet 3  . [DISCONTINUED] azithromycin (ZITHROMAX Z-PAK) 250 MG tablet Take as directed (Patient not taking: Reported on 08/11/2015) 6 each 0  . [DISCONTINUED] HYDROcodone-acetaminophen (NORCO/VICODIN) 5-325 MG per tablet Take 1/2-1 tablets every 6 hours as needed for severe pain. (Patient not taking: Reported on 08/11/2015) 8 tablet 0  . [DISCONTINUED] predniSONE (DELTASONE) 20 MG tablet Take 60 mg daily x 2 days then 40 mg daily x 2 days then 20 mg daily x 2 days 12 tablet 0  . [DISCONTINUED] Vitamin D, Ergocalciferol, (DRISDOL) 50000 UNITS CAPS capsule Take 1 capsule (50,000 Units total) by mouth every 7 (seven) days. (Patient not taking: Reported on 08/02/2014) 12 capsule 0    No results found for this or any previous visit (from the past 48 hour(s)). No results found.  Review Of Systems Constitutional: Negative for fever.  HENT: Negative for congestion.  Eyes: Negative for redness.  Respiratory: Positive for shortness of breath.  Cardiovascular: Negative for chest pain.  Gastrointestinal: Negative for abdominal distention.  Musculoskeletal: Positive for arthralgias and varicose vein of right leg.Marland Kitchen  Skin: Negative for rash.  Neurological: Negative for speech difficulty.  Psychiatric/Behavioral: Negative for confusion.   Blood pressure 171/97, pulse 78, temperature 97.6 F (36.4 C), temperature source Oral, resp. rate 20, height $RemoveBe'5\' 10"'jXSWvPQCc$  (1.778 m), weight 101.424 kg (223 lb 9.6 oz), SpO2 97 %. Physical Exam  Constitutional: He is oriented to person, place, and time. He appears well-developed and well-nourished. Has mild respiratory distress. HENT: Normocephalic. Mouth/Throat: Oropharynx is erythematous and moist. Eyes: Wears glasses, Conjunctivae are normal. Pupils are equal, round, and reactive to light.  Neck: Normal range of motion. Neck supple.   Cardiovascular: Normal rate, regular rhythm and normal heart sounds.  Pulmonary/Chest: Bilateral rhonchi.   Abdominal: Soft. Bowel sounds are normal. He exhibits no distension. There is no tenderness. There is no rebound.  Musculoskeletal: Normal range of motion. He exhibits no edema or tenderness.  Neurological: He is alert and oriented to person, place, and time. No cranial nerve deficit. Coordination normal.  Skin: Skin is warm and dry.  Psychiatric: He has a normal mood and affect. His behavior is normal. Judgment and thought content normal.  Nursing note and vitals reviewed.  Assessment/Plan Shortness of breath r/o pneumonia Possible exacerbation of asthmatic bronchitis Hypertension H/O tobacco use disorder  Admit/IV antibiotics and breathing treatment.  Birdie Riddle, MD  08/15/2015, 4:25 PM

## 2015-08-15 NOTE — Progress Notes (Signed)
Pt arrived via w/c to rm 2w36. See full assessment. Daughter at bedside. Dr. Doylene Canard contacted. On way to see pt. Will continue to monitor. Vicente Males  Therapist, sports

## 2015-08-16 ENCOUNTER — Ambulatory Visit (HOSPITAL_COMMUNITY): Payer: Medicare Other

## 2015-08-16 DIAGNOSIS — I10 Essential (primary) hypertension: Secondary | ICD-10-CM | POA: Diagnosis not present

## 2015-08-16 DIAGNOSIS — E7800 Pure hypercholesterolemia, unspecified: Secondary | ICD-10-CM | POA: Diagnosis not present

## 2015-08-16 DIAGNOSIS — J45901 Unspecified asthma with (acute) exacerbation: Secondary | ICD-10-CM | POA: Diagnosis not present

## 2015-08-16 DIAGNOSIS — Z87891 Personal history of nicotine dependence: Secondary | ICD-10-CM | POA: Diagnosis not present

## 2015-08-16 LAB — CBC
HCT: 35.7 % — ABNORMAL LOW (ref 39.0–52.0)
Hemoglobin: 12.7 g/dL — ABNORMAL LOW (ref 13.0–17.0)
MCH: 28.9 pg (ref 26.0–34.0)
MCHC: 35.6 g/dL (ref 30.0–36.0)
MCV: 81.3 fL (ref 78.0–100.0)
Platelets: 192 10*3/uL (ref 150–400)
RBC: 4.39 MIL/uL (ref 4.22–5.81)
RDW: 15.2 % (ref 11.5–15.5)
WBC: 8.1 10*3/uL (ref 4.0–10.5)

## 2015-08-16 LAB — BASIC METABOLIC PANEL
ANION GAP: 9 (ref 5–15)
BUN: 16 mg/dL (ref 6–20)
CALCIUM: 8.5 mg/dL — AB (ref 8.9–10.3)
CO2: 26 mmol/L (ref 22–32)
Chloride: 103 mmol/L (ref 101–111)
Creatinine, Ser: 1.27 mg/dL — ABNORMAL HIGH (ref 0.61–1.24)
GFR calc Af Amer: >60 mL/min (ref >60)
GFR, EST NON AFRICAN AMERICAN: 53 mL/min — AB (ref >60)
GLUCOSE: 152 mg/dL — AB (ref 65–99)
Potassium: 4 mmol/L (ref 3.5–5.1)
Sodium: 138 mmol/L (ref 135–145)

## 2015-08-16 NOTE — Progress Notes (Signed)
Ref: Lorayne Marek, MD   Subjective:  Wheezing continues. + cough. Afebrile. Chest x-ray negative for pneumonia.  Objective:  Vital Signs in the last 24 hours: Temp:  [97.6 F (36.4 C)-98.2 F (36.8 C)] 98.2 F (36.8 C) (01/03 1319) Pulse Rate:  [62-78] 73 (01/03 1319) Cardiac Rhythm:  [-] Normal sinus rhythm (01/03 0830) Resp:  [18-20] 18 (01/03 1319) BP: (158-186)/(78-97) 166/81 mmHg (01/03 1319) SpO2:  [95 %-99 %] 97 % (01/03 1319) Weight:  [101.424 kg (223 lb 9.6 oz)] 101.424 kg (223 lb 9.6 oz) (01/02 1614)  Physical Exam: BP Readings from Last 1 Encounters:  08/16/15 166/81    Wt Readings from Last 1 Encounters:  08/15/15 101.424 kg (223 lb 9.6 oz)    Weight change:   HEENT: [place]/AT, Eyes-Brown, PERL, EOMI, Conjunctiva-Pink, Sclera-Non-icteric Neck: No JVD, No bruit, Trachea midline. Lungs:  Wheezing, Bilateral. Cardiac:  Regular rhythm, normal S1 and S2, no S3.  Abdomen:  Soft, non-tender. Extremities:  No edema present. No cyanosis. No clubbing. CNS: AxOx3, Cranial nerves grossly intact, moves all 4 extremities. Right handed. Skin: Warm and dry.   Intake/Output from previous day: 01/02 0701 - 01/03 0700 In: 240 [P.O.:240] Out: 700 [Urine:700]    Lab Results: BMET    Component Value Date/Time   NA 138 08/16/2015 0225   NA 141 08/15/2015 1710   NA 142 08/11/2015 2246   K 4.0 08/16/2015 0225   K 4.3 08/15/2015 1710   K 3.5 08/11/2015 2246   CL 103 08/16/2015 0225   CL 108 08/15/2015 1710   CL 105 08/11/2015 2246   CO2 26 08/16/2015 0225   CO2 23 08/15/2015 1710   CO2 26 08/11/2015 2237   GLUCOSE 152* 08/16/2015 0225   GLUCOSE 149* 08/15/2015 1710   GLUCOSE 87 08/11/2015 2246   BUN 16 08/16/2015 0225   BUN 17 08/15/2015 1710   BUN 17 08/11/2015 2246   CREATININE 1.27* 08/16/2015 0225   CREATININE 1.24 08/15/2015 1710   CREATININE 1.30* 08/11/2015 2246   CREATININE 1.12 10/19/2014 1021   CREATININE 1.27 07/19/2014 0946   CALCIUM 8.5* 08/16/2015  0225   CALCIUM 8.8* 08/15/2015 1710   CALCIUM 9.2 08/11/2015 2237   GFRNONAA 53* 08/16/2015 0225   GFRNONAA 55* 08/15/2015 1710   GFRNONAA 47* 08/11/2015 2237   GFRNONAA 64 10/19/2014 1021   GFRNONAA 55* 07/19/2014 0946   GFRAA >60 08/16/2015 0225   GFRAA >60 08/15/2015 1710   GFRAA 55* 08/11/2015 2237   GFRAA 74 10/19/2014 1021   GFRAA 63 07/19/2014 0946   CBC    Component Value Date/Time   WBC 8.1 08/16/2015 0225   RBC 4.39 08/16/2015 0225   HGB 12.7* 08/16/2015 0225   HCT 35.7* 08/16/2015 0225   PLT 192 08/16/2015 0225   MCV 81.3 08/16/2015 0225   MCH 28.9 08/16/2015 0225   MCHC 35.6 08/16/2015 0225   RDW 15.2 08/16/2015 0225   LYMPHSABS 1.6 08/15/2015 1710   MONOABS 0.2 08/15/2015 1710   EOSABS 0.0 08/15/2015 1710   BASOSABS 0.0 08/15/2015 1710   HEPATIC Function Panel  Recent Labs  10/19/14 1021 08/11/15 2237 08/15/15 1710  PROT 6.5 7.0 6.3*   HEMOGLOBIN A1C No components found for: HGA1C,  MPG CARDIAC ENZYMES Lab Results  Component Value Date   CKTOTAL 222 12/17/2008   CKMB 2.6 12/17/2008   TROPONINI 0.02        NO INDICATION OF MYOCARDIAL INJURY. 12/17/2008   BNP No results for input(s): PROBNP in the last  8760 hours. TSH No results for input(s): TSH in the last 8760 hours. CHOLESTEROL No results for input(s): CHOL in the last 8760 hours.  Scheduled Meds: . albuterol  2.5 mg Nebulization Q6H  . alfuzosin  10 mg Oral Daily  . allopurinol  300 mg Oral Daily  . atorvastatin  10 mg Oral Daily  . azithromycin  500 mg Intravenous Q24H  . cefTRIAXone (ROCEPHIN)  IV  1 g Intravenous Q24H  . colchicine  0.6 mg Oral QODAY  . enoxaparin (LOVENOX) injection  40 mg Subcutaneous Q24H  . irbesartan  75 mg Oral Daily  . sodium chloride  3 mL Intravenous Q12H   Continuous Infusions:  PRN Meds:.zolpidem  Assessment/Plan: Shortness of breath r/o pneumonia Possible exacerbation of asthmatic bronchitis Hypertension H/O tobacco use disorder   Continue  medical treatment.   LOS: 1 day    Dixie Dials  MD  08/16/2015, 2:47 PM

## 2015-08-16 NOTE — Progress Notes (Signed)
Utilization review completed.  

## 2015-08-16 NOTE — Progress Notes (Signed)
Echocardiogram 2D Echocardiogram has been performed.  Joelene Millin 08/16/2015, 12:16 PM

## 2015-08-17 ENCOUNTER — Other Ambulatory Visit: Payer: Self-pay | Admitting: *Deleted

## 2015-08-17 DIAGNOSIS — R0609 Other forms of dyspnea: Secondary | ICD-10-CM | POA: Diagnosis present

## 2015-08-17 DIAGNOSIS — R0602 Shortness of breath: Secondary | ICD-10-CM | POA: Diagnosis present

## 2015-08-17 DIAGNOSIS — J45901 Unspecified asthma with (acute) exacerbation: Secondary | ICD-10-CM | POA: Diagnosis not present

## 2015-08-17 MED ORDER — AMLODIPINE BESYLATE 5 MG PO TABS
5.0000 mg | ORAL_TABLET | Freq: Once | ORAL | Status: AC
  Administered 2015-08-17: 5 mg via ORAL
  Filled 2015-08-17: qty 1

## 2015-08-17 MED ORDER — PREDNISONE 10 MG (21) PO TBPK: 10.0000 mg | ORAL_TABLET | Freq: Every day | ORAL | Status: DC

## 2015-08-17 MED ORDER — AZITHROMYCIN 500 MG PO TABS: 500.0000 mg | ORAL_TABLET | Freq: Every day | ORAL | Status: DC

## 2015-08-17 MED ORDER — AZITHROMYCIN 500 MG PO TABS
500.0000 mg | ORAL_TABLET | Freq: Every day | ORAL | Status: DC
  Administered 2015-08-17: 500 mg via ORAL
  Filled 2015-08-17: qty 1

## 2015-08-17 MED ORDER — PREDNISONE 20 MG PO TABS
40.0000 mg | ORAL_TABLET | Freq: Once | ORAL | Status: AC
  Administered 2015-08-17: 40 mg via ORAL
  Filled 2015-08-17: qty 2

## 2015-08-17 MED ORDER — ATORVASTATIN CALCIUM 10 MG PO TABS
10.0000 mg | ORAL_TABLET | Freq: Every day | ORAL | Status: DC
  Administered 2015-08-17: 10 mg via ORAL
  Filled 2015-08-17: qty 1

## 2015-08-17 NOTE — Progress Notes (Signed)
Patient discharged, education completed and patient states understanding, went over the room with the patient, no personal belongings left behind. Taken home by family member.

## 2015-08-17 NOTE — Telephone Encounter (Signed)
Medication was refilled by Dr. Doreene Burke on 08/12/15 with one additional refill.

## 2015-08-17 NOTE — Discharge Summary (Signed)
Physician Discharge Summary  Patient ID: Gary Pierce MRN: [id number] DOB/AGE: [birth year] 77 y.o.  Admit date: 08/15/2015 Discharge date: 08/17/2015  Admission Diagnoses: Shortness of breath r/o pneumonia Essential hypertension Hyperlipidemia Obesity  Discharge Diagnoses:  Principal Problem: * Acute exacerbation of asthmatic bronchitis * Active Problems:   Essential hypertension   Hyperlipidemia   SOB (shortness of breath)   Obesity  Discharged Condition: fair  Hospital Course: 75 y.o. male hx of HTN, HL, former smoker here with shortness of breath x 5 days. He states that he has been coughing and wheezing for the last 5 days. Initially he was just coughing up clear sputum but since Thursday he has several episodes of blood-tinged sputum. Patient states that he was a former smoker but was never diagnosed with COPD. Patient denies any history of CAD. No hx of PE or TB. He responded well to IV ceftriaxone and po azithromycin with solumedrol followed by po prednisone. He will monitor his blood sugar and decrease carbohydrate intake. He will be followed by me in 1 week.  Consults: None  Significant Diagnostic Studies: labs: Near normal CMET except low total protein and albumin and elevated blood sugar with borderline serum creatinine.  Treatments: antibiotics: ceftriaxone and azithromycin + nebulizer treatment  Discharge Exam: Blood pressure 165/82, pulse 68, temperature 97.4 F (36.3 C), temperature source Oral, resp. rate 18, height $RemoveBe'5\' 10"'sdSWLsOPF$  (1.778 m), weight 101.424 kg (223 lb 9.6 oz), SpO2 96 %. HEENT: [place]/AT, Eyes-Brown, PERL, EOMI, Conjunctiva-Pink, Sclera-Non-icteric Neck: No JVD, No bruit, Trachea midline. Lungs: Mild wheezing, Bilateral. Cardiac: Regular rhythm, normal S1 and S2, no S3.  Abdomen: Soft, non-tender. Extremities: No edema present. No cyanosis. No clubbing. CNS: AxOx3, Cranial nerves grossly intact, moves all 4 extremities. Right handed. Skin: Warm and  dry.  Disposition: 01-Home or Self Care     Medication List    STOP taking these medications        tadalafil 5 MG tablet  Commonly known as:  CIALIS      TAKE these medications        albuterol 108 (90 Base) MCG/ACT inhaler  Commonly known as:  PROVENTIL HFA;VENTOLIN HFA  Inhale 2 puffs into the lungs every 6 (six) hours as needed for wheezing or shortness of breath.     alfuzosin 10 MG 24 hr tablet  Commonly known as:  UROXATRAL  Take 1 tablet (10 mg total) by mouth daily. Needs office visit for more refills     allopurinol 300 MG tablet  Commonly known as:  ZYLOPRIM  Take 1 tablet (300 mg total) by mouth daily.     atorvastatin 10 MG tablet  Commonly known as:  LIPITOR  Take 1 tablet (10 mg total) by mouth daily.     azithromycin 500 MG tablet  Commonly known as:  ZITHROMAX  Take 1 tablet (500 mg total) by mouth daily.     colchicine 0.6 MG tablet  Commonly known as:  COLCRYS  Take 1 tablet (0.6 mg total) by mouth every other day.     olmesartan 40 MG tablet  Commonly known as:  BENICAR  TAKE ONE TABLET BY MOUTH  DAILY     predniSONE 10 MG (21) Tbpk tablet  Commonly known as:  STERAPRED UNI-PAK 21 TAB  Take 1 tablet (10 mg total) by mouth daily. Three tablet daily x 2 days then two daily x 3 days, then one daily x 5 days then 1/2 daily x 8 days  Follow-up Information    Follow up with [facility], MD. Schedule an appointment as soon as possible for a visit in 1 week.   Specialty:  Cardiology   Contact information:   [place] [address] [phone]       Signed: Birdie Riddle 08/17/2015, 8:48 PM

## 2015-08-17 NOTE — Progress Notes (Signed)
This admission has been reviewed and determined not to meet inpatient level of care. Both attending Physician and Medical Director are in agreement this should be an Observation encounter according to the Medicare Conditions of Participation as set forth in CFR 42 Chapter 456 482.12 (c) and the Medicare Condition Code-44 Regulations CFR 42 Chapter 100 - 04 50.3. The Patient and/or Patient Representative was notified via delivery of the "[place]".

## 2017-04-10 ENCOUNTER — Ambulatory Visit (INDEPENDENT_AMBULATORY_CARE_PROVIDER_SITE_OTHER): Payer: Medicare Other | Admitting: Podiatry

## 2017-04-10 ENCOUNTER — Encounter: Payer: Self-pay | Admitting: Podiatry

## 2017-04-10 ENCOUNTER — Ambulatory Visit: Payer: Medicare Other

## 2017-04-10 DIAGNOSIS — M79674 Pain in right toe(s): Secondary | ICD-10-CM

## 2017-04-10 DIAGNOSIS — M7751 Other enthesopathy of right foot: Secondary | ICD-10-CM

## 2017-04-10 DIAGNOSIS — M10071 Idiopathic gout, right ankle and foot: Secondary | ICD-10-CM | POA: Diagnosis not present

## 2017-04-10 MED ORDER — BETAMETHASONE SOD PHOS & ACET 6 (3-3) MG/ML IJ SUSP: 3.0000 mg | Freq: Once | INTRAMUSCULAR | Status: DC

## 2017-04-10 NOTE — Progress Notes (Signed)
   Subjective:    Patient ID: Gary Pierce, male    DOB: 09-Apr-1941, 76 y.o.   MRN: [id number]  HPI    Review of Systems  Cardiovascular: Positive for leg swelling.  All other systems reviewed and are negative.      Objective:   Physical Exam        Assessment & Plan:

## 2017-04-10 NOTE — Progress Notes (Signed)
Patient ID: Gary Pierce, male   DOB: [birth year], 76 y.o.   MRN: [id number]   HPI:  76 year old male presents the office today for evaluation of pain to the fourth digit right foot. Patient denies trauma with exception of stubbing his toe proximate 5 years prior. Patient states that he noticed some drainage coming out of the toe a few days prior to presentation today. Patient states that he has significant amount of pain and he does have a history of gout.   Physical Exam: General: The patient is alert and oriented x3 in no acute distress.  Dermatology:  Small draining sinus noted overlying the DIPJ of the fourth digit right foot with chalky drainage  Consistent with gouty tophus. Skin is warm, dry and supple bilateral lower extremities.  Vascular: Palpable pedal pulses bilaterally. No edema or erythema noted. Capillary refill within normal limits.  Neurological: Epicritic and protective threshold grossly intact bilaterally.   Musculoskeletal Exam: Range of motion within normal limits to all pedal and ankle joints bilateral. Muscle strength 5/5 in all groups bilateral. Pain on palpation noted to the fourth digit right foot   Assessment: 1.  Acute gout attack fourth digit right foot DIPJ  2. Capsulitis fourth digit toe right foot   Plan of Care:  1. Patient was evaluated. 2.  Injection  0.5 mL Celestone Soluspan injected in the fourth digit right foot PIPJ  3. 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 4. Discussed maintaining healthy diet tocontrol acute gout attacks  5. Return to clinic when necessary   Edrick Kins, DPM [facility]  Dr. Edrick Kins, DPM    [address], [place] [address]                Office [phone]  Fax [phone]

## 2017-04-11 NOTE — Addendum Note (Signed)
Addended by: Tomi Bamberger on: 04/11/2017 03:23 PM   Modules accepted: Orders

## 2017-06-04 ENCOUNTER — Ambulatory Visit (HOSPITAL_COMMUNITY)
Admission: EM | Admit: 2017-06-04 | Discharge: 2017-06-04 | Disposition: A | Discharge status: Home / Self Care | Payer: Medicare Other | Attending: Emergency Medicine | Admitting: Emergency Medicine

## 2017-06-04 ENCOUNTER — Encounter (HOSPITAL_COMMUNITY): Payer: Self-pay | Admitting: Emergency Medicine

## 2017-06-04 DIAGNOSIS — M1A9XX1 Chronic gout, unspecified, with tophus (tophi): Secondary | ICD-10-CM | POA: Diagnosis not present

## 2017-06-04 MED ORDER — CEPHALEXIN 500 MG PO CAPS: 500.0000 mg | ORAL_CAPSULE | Freq: Four times a day (QID) | ORAL | 0 refills | Status: DC

## 2017-06-04 MED ORDER — PREDNISONE 20 MG PO TABS: ORAL_TABLET | ORAL | 0 refills | Status: DC

## 2017-06-04 NOTE — Discharge Instructions (Signed)
Take the 2 prescriptions as directed. Be sure you take the prednisone with food. Soak foot in warm salt water 2-3 times a day.

## 2017-06-04 NOTE — ED Triage Notes (Signed)
Pt states he hit his toe next to his pinkie toe on the R foot FIVE years ago and states now its hurting him.

## 2017-06-04 NOTE — ED Provider Notes (Signed)
[place]    CSN: [id number] Arrival date & time: 06/04/17  1514     History   Chief Complaint Chief Complaint  Patient presents with  . Toe Pain    HPI Gary Pierce is a 76 y.o. male.   76 year old male presents to the urgent care with pain to the right fourth digit. He was seen on August 29 by podiatrist for the same problem. The toe was darkened and had some sinus drainage from the digit. This was expressed and the podiatrist injected Celestone into the toe. The history and physical exam as written by the podiatrist is almost identical to his presentation today. He states he stubbed his right fourth toe approximate 5 years ago and had no sequelae. He believes that the problems he is having now is due to that incident.      Past Medical History:  Diagnosis Date  . Hypercholesterolemia   . Hypertension     Patient Active Problem List   Diagnosis Date Noted  . SOB (shortness of breath) 08/17/2015  . Shortness of breath 08/15/2015    Class: Acute  . History of gout 07/19/2014  . Essential hypertension 07/19/2014  . BPH (benign prostatic hyperplasia) 07/19/2014  . Hyperlipidemia 07/19/2014  . Asthma with acute exacerbation 07/19/2014  . Erectile dysfunction 07/19/2014  . Pain in limb 10/02/2012  . Varicose veins of lower extremities with other complications 97/35/3299    Past Surgical History:  Procedure Laterality Date  . COLONOSCOPY     2015  due in 2017       Home Medications    Prior to Admission medications   Medication Sig Start Date End Date Taking? Authorizing Provider  albuterol (PROVENTIL HFA;VENTOLIN HFA) 108 (90 BASE) MCG/ACT inhaler Inhale 2 puffs into the lungs every 6 (six) hours as needed for wheezing or shortness of breath. 08/02/14  Yes Advani, Vernon Prey, MD  cephALEXin (KEFLEX) 500 MG capsule Take 1 capsule (500 mg total) by mouth 4 (four) times daily. 06/04/17   Janne Napoleon, NP  predniSONE (DELTASONE) 20 MG tablet 3 tabs po x 2  d, then 2 tabs po daily for 4 days. Take with food 06/04/17   Janne Napoleon, NP    Family History Family History  Problem Relation Age of Onset  . Cancer Mother   . Heart disease Father   . Hyperlipidemia Father   . Hypertension Father   . Heart attack Father     Social History Social History  Substance Use Topics  . Smoking status: Former Smoker    Years: 25.00    Types: Cigarettes    Quit date: 09/18/2012  . Smokeless tobacco: Never Used  . Alcohol use No     Allergies   Patient has no known allergies.   Review of Systems Review of Systems  Constitutional: Negative.   HENT: Negative.   Musculoskeletal: Positive for arthralgias.  Neurological: Negative.   All other systems reviewed and are negative.    Physical Exam Triage Vital Signs ED Triage Vitals [06/04/17 1549]  Enc Vitals Group     BP (!) 150/94     Pulse Rate 70     Resp 16     Temp 98.2 F (36.8 C)     Temp Source Oral     SpO2 100 %     Weight      Height      Head Circumference      Peak Flow      Pain Score  10     Pain Loc      Pain Edu?      Excl. in [place]?    No data found.   Updated Vital Signs BP (!) 150/94   Pulse 70   Temp 98.2 F (36.8 C) (Oral)   Resp 16   SpO2 100%   Visual Acuity Right Eye Distance:   Left Eye Distance:   Bilateral Distance:    Right Eye Near:   Left Eye Near:    Bilateral Near:     Physical Exam  Constitutional: He is oriented to person, place, and time. He appears well-developed and well-nourished.  Neck: Neck supple.  Pulmonary/Chest: Effort normal.  Musculoskeletal:  Right fourth toe darkened but not black. The soft tissue above the base of the nail has a drop of white purulent appearing material. There is no other areas of drainage. No erythema. No lymphangitis. This is the same drainage that the podiatrist had referred to as material exuding from a tophus. Positive for much tenderness. Light touch produces pain. No apparent nail involvement.    Neurological: He is alert and oriented to person, place, and time.  Skin: Skin is warm and dry.  Nursing note and vitals reviewed.    UC Treatments / Results  Labs (all labs ordered are listed, but only abnormal results are displayed) Labs Reviewed - No data to display  EKG  EKG Interpretation None       Radiology No results found.  Procedures Procedures (including critical care time)  Medications Ordered in UC Medications - No data to display   Initial Impression / Assessment and Plan / UC Course  I have reviewed the triage vital signs and the nursing notes.  Pertinent labs & imaging results that were available during my care of the patient were reviewed by me and considered in my medical decision making (see chart for details).    Take the 2 prescriptions as directed. Be sure you take the prednisone with food. Soak foot and warm salt water 2-3 times a day.    Final Clinical Impressions(s) / UC Diagnoses   Final diagnoses:  Gouty tophus    New Prescriptions New Prescriptions   CEPHALEXIN (KEFLEX) 500 MG CAPSULE    Take 1 capsule (500 mg total) by mouth 4 (four) times daily.   PREDNISONE (DELTASONE) 20 MG TABLET    3 tabs po x 2 d, then 2 tabs po daily for 4 days. Take with food     Controlled Substance Prescriptions [place] Controlled Substance Registry consulted? Not Applicable   Janne Napoleon, NP 06/04/17 1702

## 2017-07-01 ENCOUNTER — Emergency Department (HOSPITAL_COMMUNITY): Payer: Medicare Other

## 2017-07-01 ENCOUNTER — Other Ambulatory Visit: Payer: Self-pay

## 2017-07-01 ENCOUNTER — Emergency Department (HOSPITAL_COMMUNITY)
Admission: EM | Admit: 2017-07-01 | Discharge: 2017-07-01 | Disposition: A | Discharge status: Home / Self Care | Payer: Medicare Other | Attending: Emergency Medicine | Admitting: Emergency Medicine

## 2017-07-01 ENCOUNTER — Encounter (HOSPITAL_COMMUNITY): Payer: Self-pay

## 2017-07-01 DIAGNOSIS — J45901 Unspecified asthma with (acute) exacerbation: Secondary | ICD-10-CM | POA: Insufficient documentation

## 2017-07-01 DIAGNOSIS — Z79899 Other long term (current) drug therapy: Secondary | ICD-10-CM | POA: Diagnosis not present

## 2017-07-01 DIAGNOSIS — Z87891 Personal history of nicotine dependence: Secondary | ICD-10-CM | POA: Insufficient documentation

## 2017-07-01 DIAGNOSIS — I10 Essential (primary) hypertension: Secondary | ICD-10-CM | POA: Insufficient documentation

## 2017-07-01 DIAGNOSIS — M1A9XX1 Chronic gout, unspecified, with tophus (tophi): Secondary | ICD-10-CM | POA: Diagnosis not present

## 2017-07-01 DIAGNOSIS — M79674 Pain in right toe(s): Secondary | ICD-10-CM | POA: Diagnosis present

## 2017-07-01 MED ORDER — PREDNISONE 20 MG PO TABS: 40.0000 mg | ORAL_TABLET | Freq: Every day | ORAL | 0 refills | Status: DC

## 2017-07-01 NOTE — Discharge Instructions (Signed)
Take the steroids as prescribed.  Follow-up with a podiatrist for further evaluation

## 2017-07-01 NOTE — ED Notes (Signed)
Pt ambulatory and independent at discharge.  Verbalized understanding of discharge instructions 

## 2017-07-01 NOTE — ED Triage Notes (Signed)
Patient states he had a toe injury 5 years ago. Patient states he had slight blackness x 5 months, but is now having increased blackness and pain to the right 4th toe.

## 2017-07-01 NOTE — ED Provider Notes (Signed)
[place] DEPT Provider Note   CSN: [id number] Arrival date & time: 07/01/17  1034     History   Chief Complaint Chief Complaint  Patient presents with  . Toe Injury    HPI Gary Pierce is a 76 y.o. male.  HPI Patient presents to the emergency room for evaluation of right toe pain.  Patient states he had this trouble for several years.  It has not given him much trouble until recently (last month or so).  Patient has noticed increased tenderness.  He thinks this was all triggered by stubbing it against something recently.  He previously was seen by a podiatrist as well and is in urgent care doctor.  He was told it was related to gout.  He has taken antibiotics as well as steroids.  Patient does not feel like it is getting much better.  He decided to come to the emergency room.  He notices drainage of fluid when he squeezes a cystoscopy.  The skin looks darker than usual.  He denies any fevers or chills. Past Medical History:  Diagnosis Date  . Hypercholesterolemia   . Hypertension     Patient Active Problem List   Diagnosis Date Noted  . SOB (shortness of breath) 08/17/2015  . Shortness of breath 08/15/2015    Class: Acute  . History of gout 07/19/2014  . Essential hypertension 07/19/2014  . BPH (benign prostatic hyperplasia) 07/19/2014  . Hyperlipidemia 07/19/2014  . Asthma with acute exacerbation 07/19/2014  . Erectile dysfunction 07/19/2014  . Pain in limb 10/02/2012  . Varicose veins of lower extremities with other complications 78/67/6720    Past Surgical History:  Procedure Laterality Date  . COLONOSCOPY     2015  due in 2017       Home Medications    Prior to Admission medications   Medication Sig Start Date End Date Taking? Authorizing Provider  albuterol (PROVENTIL HFA;VENTOLIN HFA) 108 (90 BASE) MCG/ACT inhaler Inhale 2 puffs into the lungs every 6 (six) hours as needed for wheezing or shortness of breath. 08/02/14    Lorayne Marek, MD  cephALEXin (KEFLEX) 500 MG capsule Take 1 capsule (500 mg total) by mouth 4 (four) times daily. 06/04/17   Janne Napoleon, NP  predniSONE (DELTASONE) 20 MG tablet Take 2 tablets (40 mg total) daily by mouth. 07/01/17   Dorie Rank, MD    Family History Family History  Problem Relation Age of Onset  . Cancer Mother   . Heart disease Father   . Hyperlipidemia Father   . Hypertension Father   . Heart attack Father     Social History Social History   Tobacco Use  . Smoking status: Former Smoker    Years: 25.00    Types: Cigarettes    Last attempt to quit: 09/18/2012    Years since quitting: 4.7  . Smokeless tobacco: Never Used  Substance Use Topics  . Alcohol use: No    Alcohol/week: 0.0 oz  . Drug use: No     Allergies   Patient has no known allergies.   Review of Systems Review of Systems  All other systems reviewed and are negative.    Physical Exam Updated Vital Signs BP (!) 147/92 (BP Location: Left Arm)   Pulse 64   Temp 98.3 F (36.8 C) (Oral)   Resp 16   Ht 1.778 m ($Remove'5\' 10"'XGlznTN$ )   Wt 90.7 kg (200 lb)   SpO2 97%   BMI 28.70 kg/m   Physical  Exam  Constitutional: He appears well-developed and well-nourished. No distress.  HENT:  Head: Normocephalic and atraumatic.  Right Ear: External ear normal.  Left Ear: External ear normal.  Eyes: Conjunctivae are normal. Right eye exhibits no discharge. Left eye exhibits no discharge. No scleral icterus.  Neck: Neck supple. No tracheal deviation present.  Cardiovascular: Normal rate.  Pulmonary/Chest: Effort normal. No stridor. No respiratory distress.  Abdominal: He exhibits no distension.  Musculoskeletal: He exhibits no edema.       Feet:  ?gouty tophi dip joint 4th toe, no purulent drainage, some serous drainage with compression, no erythema, hyperpigmentation of the skin, no cyanosis, foot is warm and well perfused  Neurological: He is alert. Cranial nerve deficit: no gross deficits.  Skin:  Skin is warm and dry. No rash noted.  Psychiatric: He has a normal mood and affect.  Nursing note and vitals reviewed.    ED Treatments / Results  Labs (all labs ordered are listed, but only abnormal results are displayed) Labs Reviewed - No data to display  EKG  EKG Interpretation None       Radiology Dg Toe 4th Right  Result Date: 07/01/2017 CLINICAL DATA:  Fourth toe pain EXAM: RIGHT FOURTH TOE COMPARISON:  None. FINDINGS: No acute fracture or dislocation. There is narrowing of the distal interphalangeal joint space of the fourth toe. Osteophyte formation along the lateral aspect of the proximal interphalangeal joint. No erosions or evidence of inflammatory arthropathy. IMPRESSION: No acute fracture or dislocation of the right fourth toe. 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 Electronically Signed   By: Ulyses Jarred M.D.   On: 07/01/2017 17:42    Procedures Procedures (including critical care time)  Medications Ordered in ED Medications - No data to display   Initial Impression / Assessment and Plan / ED Course  I have reviewed the triage vital signs and the nursing notes.  Pertinent labs & imaging results that were available during my care of the patient were reviewed by me and considered in my medical decision making (see chart for details).   Patient was previously seen by a podiatrist.  He was told he had gout.  Patient feels like he is not getting any better.  It sounds like he saw another podiatrist for a second opinion and was told he could have a surgical procedure to have the gouty tophi removed and then they would suture him back up afterwards.  Patient was wondering if we did that here.  I explained to patient that as an emergency physician I did not do that procedure.  It is possible that a podiatrist may do that here at [facility] but he would need to do that by seeing a podiatrist.  Patient does not appear to have  any signs of an abscess.  There does not appear to be signs of cellulitis.  I recommend outpatient follow-up with the podiatrist. Final Clinical Impressions(s) / ED Diagnoses   Final diagnoses:  Gouty tophi    ED Discharge Orders        Ordered    predniSONE (DELTASONE) 20 MG tablet  Daily     07/01/17 1829       Dorie Rank, MD 07/01/17 1831

## 2017-07-12 ENCOUNTER — Ambulatory Visit (INDEPENDENT_AMBULATORY_CARE_PROVIDER_SITE_OTHER): Payer: Medicare Other

## 2017-07-12 ENCOUNTER — Encounter: Payer: Self-pay | Admitting: Podiatry

## 2017-07-12 ENCOUNTER — Ambulatory Visit (INDEPENDENT_AMBULATORY_CARE_PROVIDER_SITE_OTHER): Payer: Medicare Other | Admitting: Podiatry

## 2017-07-12 DIAGNOSIS — M7751 Other enthesopathy of right foot: Secondary | ICD-10-CM

## 2017-07-12 DIAGNOSIS — M10071 Idiopathic gout, right ankle and foot: Secondary | ICD-10-CM

## 2017-07-12 DIAGNOSIS — M779 Enthesopathy, unspecified: Principal | ICD-10-CM

## 2017-07-12 DIAGNOSIS — M778 Other enthesopathies, not elsewhere classified: Secondary | ICD-10-CM

## 2017-07-12 DIAGNOSIS — L03031 Cellulitis of right toe: Secondary | ICD-10-CM | POA: Diagnosis not present

## 2017-07-12 DIAGNOSIS — L02611 Cutaneous abscess of right foot: Secondary | ICD-10-CM | POA: Diagnosis not present

## 2017-07-12 NOTE — Progress Notes (Signed)
Subjective:   Patient ID: Gary Pierce, male   DOB: 76 y.o.   MRN: [id number]   HPI Patient presents with a painful fourth toe that has been going on for several months.  He was seen by Dr. Amalia Hailey who injected some cortisone without relief his symptoms and he states that he has had drainage from the toe with a long-term history of gout   ROS      Objective:  Physical Exam  Neurovascular status intact with patient noted to have inflamed fourth digit right at the distal interphalangeal joint.  There is slight drainage but there was no opening of the skin noted but it is irritated when pressed and has been painful for several months     Assessment:  Possibility for localized infection of the distal fourth digit right versus possibility of tophaceous gout deposit     Plan:  Reviewed condition and x-rays with patient.  At this point I did discuss that amputation may be necessary for this point for this in the future but at this time I went ahead and I did a proximal nerve block I open the area and I cultured to try to understand is there are bacteria infiltration to the area.  I reviewed his x-rays indicating that there is some bone destruction.  X-rays indicate there is some lysis occurring of the distal phalanx right which may be due to a gouty process or possible infective process

## 2017-07-16 LAB — WOUND CULTURE
MICRO NUMBER: 81348418
SPECIMEN QUALITY:: ADEQUATE

## 2017-07-19 ENCOUNTER — Encounter: Payer: Self-pay | Admitting: Podiatry

## 2017-07-19 ENCOUNTER — Ambulatory Visit (INDEPENDENT_AMBULATORY_CARE_PROVIDER_SITE_OTHER): Payer: Medicare Other | Admitting: Podiatry

## 2017-07-19 DIAGNOSIS — M7751 Other enthesopathy of right foot: Secondary | ICD-10-CM

## 2017-07-19 DIAGNOSIS — M10071 Idiopathic gout, right ankle and foot: Secondary | ICD-10-CM

## 2017-07-19 DIAGNOSIS — M779 Enthesopathy, unspecified: Principal | ICD-10-CM

## 2017-07-19 DIAGNOSIS — M778 Other enthesopathies, not elsewhere classified: Secondary | ICD-10-CM

## 2017-07-19 MED ORDER — CEPHALEXIN 500 MG PO CAPS: 500.0000 mg | ORAL_CAPSULE | Freq: Three times a day (TID) | ORAL | 1 refills | Status: DC

## 2017-07-23 NOTE — Progress Notes (Signed)
Subjective:   Patient ID: Gary Pierce, male   DOB: 76 y.o.   MRN: [id number]   HPI Patient presents stating that his right foot but he still has some swelling noted.    ROS      Objective:  Physical Exam  Neurovascular status intact with inflammation redness around the first MPJ right that is still present but improved from previous visit.     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     Plan:  H&P and spent a great deal time discussing this with him.  Educated him on gout and medications to utilize along with wider type shoes and soaks.  Patient will be seen back for [place] to recheck and is given strict instructions if any inflammation should occur to let us know immediately.

## 2017-08-16 ENCOUNTER — Ambulatory Visit (INDEPENDENT_AMBULATORY_CARE_PROVIDER_SITE_OTHER): Payer: Medicare Other | Admitting: Podiatry

## 2017-08-16 ENCOUNTER — Encounter: Payer: Self-pay | Admitting: Podiatry

## 2017-08-16 DIAGNOSIS — M7751 Other enthesopathy of right foot: Secondary | ICD-10-CM

## 2017-08-16 DIAGNOSIS — M10071 Idiopathic gout, right ankle and foot: Secondary | ICD-10-CM

## 2017-08-16 DIAGNOSIS — M779 Enthesopathy, unspecified: Secondary | ICD-10-CM

## 2017-08-16 DIAGNOSIS — M778 Other enthesopathies, not elsewhere classified: Secondary | ICD-10-CM

## 2017-08-16 NOTE — Progress Notes (Signed)
Subjective:   Patient ID: Gary Pierce, male   DOB: 77 y.o.   MRN: [id number]   HPI Patient states doing better with the right foot with the fourth toe not draining currently and minimal inflammation around the big toe joint   ROS      Objective:  Physical Exam  No indication of advanced gouty symptoms fourth digit right with resolution and improvement from previous visit with the first MPJ calm down current time     Assessment:  Gout-like symptomatology improving currently     Plan:  Advised on soaks and padding and wider shoes and patient will be seen back to recheck

## 2017-11-26 ENCOUNTER — Other Ambulatory Visit: Payer: Self-pay | Admitting: Podiatry

## 2017-11-26 DIAGNOSIS — M869 Osteomyelitis, unspecified: Secondary | ICD-10-CM

## 2017-12-03 ENCOUNTER — Ambulatory Visit
Admission: RE | Admit: 2017-12-03 | Discharge: 2017-12-03 | Disposition: A | Discharge status: Home / Self Care | Payer: Medicare Other | Source: Ambulatory Visit | Attending: Podiatry | Admitting: Podiatry

## 2017-12-03 DIAGNOSIS — M869 Osteomyelitis, unspecified: Secondary | ICD-10-CM

## 2017-12-03 MED ORDER — GADOBENATE DIMEGLUMINE 529 MG/ML IV SOLN
8.0000 mL | Freq: Once | INTRAVENOUS | Status: AC | PRN
  Administered 2017-12-03: 8 mL via INTRAVENOUS

## 2018-05-09 ENCOUNTER — Other Ambulatory Visit: Payer: Self-pay | Admitting: Physician Assistant

## 2018-05-09 DIAGNOSIS — R519 Headache, unspecified: Secondary | ICD-10-CM

## 2018-05-09 DIAGNOSIS — R51 Headache: Principal | ICD-10-CM

## 2018-05-12 ENCOUNTER — Ambulatory Visit
Admission: RE | Admit: 2018-05-12 | Discharge: 2018-05-12 | Disposition: A | Discharge status: Home / Self Care | Payer: Medicare Other | Source: Ambulatory Visit | Attending: Physician Assistant | Admitting: Physician Assistant

## 2018-05-12 DIAGNOSIS — R51 Headache: Principal | ICD-10-CM

## 2018-05-12 DIAGNOSIS — R519 Headache, unspecified: Secondary | ICD-10-CM

## 2019-10-09 ENCOUNTER — Ambulatory Visit: Payer: Medicare Other | Attending: Internal Medicine

## 2019-10-09 DIAGNOSIS — Z23 Encounter for immunization: Secondary | ICD-10-CM

## 2019-10-09 NOTE — Progress Notes (Signed)
   Covid-19 Vaccination Clinic  Name:  Gary Pierce    MRN: [id number] DOB: Oct 26, 1940  10/09/2019  Mr. Gary Pierce was observed post Covid-19 immunization for 15 minutes without incidence. He was provided with Vaccine Information Sheet and instruction to access the V-Safe system.   Mr. Gary Pierce was instructed to call 911 with any severe reactions post vaccine: Marland Kitchen Difficulty breathing  . Swelling of your face and throat  . A fast heartbeat  . A bad rash all over your body  . Dizziness and weakness    Immunizations Administered    Name Date Dose VIS Date Route   Pfizer COVID-19 Vaccine 10/09/2019  1:12 PM 0.3 mL 07/24/2019 Intramuscular   Manufacturer: [place]   Lot: [id number]   [place]: [id number]

## 2019-11-03 ENCOUNTER — Ambulatory Visit: Payer: Medicare Other | Attending: Internal Medicine

## 2019-11-03 DIAGNOSIS — Z23 Encounter for immunization: Secondary | ICD-10-CM

## 2019-11-03 NOTE — Progress Notes (Signed)
   Covid-19 Vaccination Clinic  Name:  Gary Pierce    MRN: [id number] DOB: 1941-08-10  11/03/2019  [name] was observed post Covid-19 immunization for 15 minutes without incident. He was provided with Vaccine Information Sheet and instruction to access the V-Safe system.   [name] was instructed to call 911 with any severe reactions post vaccine: Marland Kitchen Difficulty breathing  . Swelling of face and throat  . A fast heartbeat  . A bad rash all over body  . Dizziness and weakness   Immunizations Administered    Name Date Dose VIS Date Route   Pfizer COVID-19 Vaccine 11/03/2019  2:55 PM 0.3 mL 07/24/2019 Intramuscular   Manufacturer: [place]   Lot: [id number]   [place]: [id number]

## 2020-08-16 ENCOUNTER — Other Ambulatory Visit: Payer: Self-pay

## 2020-08-16 ENCOUNTER — Ambulatory Visit (HOSPITAL_COMMUNITY)
Admission: EM | Admit: 2020-08-16 | Discharge: 2020-08-16 | Disposition: A | Discharge status: Home / Self Care | Payer: Medicare Other | Attending: Urgent Care | Admitting: Urgent Care

## 2020-08-16 DIAGNOSIS — R197 Diarrhea, unspecified: Secondary | ICD-10-CM

## 2020-08-16 DIAGNOSIS — K529 Noninfective gastroenteritis and colitis, unspecified: Secondary | ICD-10-CM

## 2020-08-16 DIAGNOSIS — R112 Nausea with vomiting, unspecified: Secondary | ICD-10-CM

## 2020-08-16 MED ORDER — LOPERAMIDE HCL 2 MG PO CAPS: 2.0000 mg | ORAL_CAPSULE | Freq: Every day | ORAL | 0 refills | Status: AC | PRN

## 2020-08-16 MED ORDER — ONDANSETRON HCL 4 MG/2ML IJ SOLN
4.0000 mg | Freq: Once | INTRAMUSCULAR | Status: AC
  Administered 2020-08-16: 4 mg via INTRAMUSCULAR

## 2020-08-16 MED ORDER — ONDANSETRON 8 MG PO TBDP: 8.0000 mg | ORAL_TABLET | Freq: Three times a day (TID) | ORAL | 0 refills | Status: AC | PRN

## 2020-08-16 MED ORDER — ONDANSETRON HCL 4 MG/2ML IJ SOLN
INTRAMUSCULAR | Status: AC
  Filled 2020-08-16: qty 2

## 2020-08-16 NOTE — Discharge Instructions (Addendum)
Make sure you push fluids drinking mostly water but mix it with Gatorade.  Try to eat light meals including soups, broths and soft foods, fruits.  You may use Zofran for your nausea and vomiting once every 8 hours.  Imodium can help with diarrhea but use this carefully limiting it to once a day only if you are having a lot of diarrhea.  Please return to the clinic if symptoms worsen or you start having severe abdominal pain not helped by taking Tylenol or start having bloody stools or blood in the vomit.

## 2020-08-16 NOTE — ED Triage Notes (Signed)
Pt presents with nausea, vomiting, and diarrhea X 2 days.

## 2020-08-16 NOTE — ED Provider Notes (Signed)
[place]   MRN: [id number] DOB: Dec 28, 1940  Subjective:   Gary Pierce is a 80 y.o. male presenting for 2-day history of nausea, vomiting, diarrhea.  Patient has had 5-6 episodes of vomiting, 3 episodes of diarrhea denies fever, sore throat, cough, chest pain, shortness of breath, bloody stools,.  No recent antibiotic use.  No recent hospitalizations.  Denies history of GI issues.  He does have a history of BPH and is on Flomax for this.  He also has high blood pressure is taking blood pressure medication.  He did go and get Covid tested 3 days ago and was negative.  Has tried to eat his normal foods but has been difficult due to decreased appetite.   Allergies  Allergen Reactions  . Cephalexin Diarrhea    Past Medical History:  Diagnosis Date  . Hypercholesterolemia   . Hypertension      Past Surgical History:  Procedure Laterality Date  . COLONOSCOPY     2015  due in 2017    Family History  Problem Relation Age of Onset  . Cancer Mother   . Heart disease Father   . Hyperlipidemia Father   . Hypertension Father   . Heart attack Father     Social History   Tobacco Use  . Smoking status: Former Smoker    Years: 25.00    Types: Cigarettes    Quit date: 09/18/2012    Years since quitting: 7.9  . Smokeless tobacco: Never Used  Vaping Use  . Vaping Use: Never used  Substance Use Topics  . Alcohol use: No    Alcohol/week: 0.0 standard drinks  . Drug use: No    ROS   Objective:   Vitals: BP 115/79 (BP Location: Right Arm)   Pulse 85   Temp (!) 97.4 F (36.3 C) (Oral)   Resp 20   SpO2 99%   Physical Exam Constitutional:      General: He is not in acute distress.    Appearance: Normal appearance. He is well-developed. He is not ill-appearing, toxic-appearing or diaphoretic.  HENT:     Head: Normocephalic and atraumatic.     Right Ear: External ear normal.     Left Ear: External ear normal.     Nose: Nose normal.     Mouth/Throat:      Mouth: Mucous membranes are moist.     Pharynx: Oropharynx is clear.  Eyes:     General: No scleral icterus.    Extraocular Movements: Extraocular movements intact.     Pupils: Pupils are equal, round, and reactive to light.  Cardiovascular:     Rate and Rhythm: Normal rate and regular rhythm.     Heart sounds: Normal heart sounds. No murmur heard. No friction rub. No gallop.   Pulmonary:     Effort: Pulmonary effort is normal. No respiratory distress.     Breath sounds: Normal breath sounds. No stridor. No wheezing, rhonchi or rales.  Abdominal:     General: Bowel sounds are normal. There is no distension.     Palpations: Abdomen is soft. There is no mass.     Tenderness: There is no abdominal tenderness. There is no right CVA tenderness, left CVA tenderness, guarding or rebound.  Neurological:     Mental Status: He is alert and oriented to person, place, and time.  Psychiatric:        Mood and Affect: Mood normal.        Behavior: Behavior normal.  Thought Content: Thought content normal.        Judgment: Judgment normal.     IM Zofran in clinic.  Assessment and Plan :   PDMP not reviewed this encounter.  1. Gastroenteritis   2. Nausea vomiting and diarrhea     Low risk factors for acute abdomen, infectious diarrhea panel.  Will manage for viral gastroenteritis with supportive care.  Rx for Zofran for nausea and vomiting, Imodium for diarrhea.  Patient is to push fluids and eat light meals including soups and soft foods. Counseled patient on potential for adverse effects with medications prescribed/recommended today, ER and return-to-clinic precautions discussed, patient verbalized understanding.    Jaynee Eagles, PA-C 08/16/20 1750

## 2020-11-03 ENCOUNTER — Ambulatory Visit (INDEPENDENT_AMBULATORY_CARE_PROVIDER_SITE_OTHER): Payer: Medicare Other

## 2020-11-03 ENCOUNTER — Encounter (HOSPITAL_COMMUNITY): Payer: Self-pay | Admitting: Emergency Medicine

## 2020-11-03 ENCOUNTER — Ambulatory Visit (HOSPITAL_COMMUNITY)
Admission: EM | Admit: 2020-11-03 | Discharge: 2020-11-03 | Disposition: A | Discharge status: Home / Self Care | Payer: Medicare Other

## 2020-11-03 ENCOUNTER — Other Ambulatory Visit: Payer: Self-pay

## 2020-11-03 DIAGNOSIS — R059 Cough, unspecified: Secondary | ICD-10-CM

## 2020-11-03 DIAGNOSIS — J069 Acute upper respiratory infection, unspecified: Secondary | ICD-10-CM

## 2020-11-03 DIAGNOSIS — R0989 Other specified symptoms and signs involving the circulatory and respiratory systems: Secondary | ICD-10-CM | POA: Diagnosis not present

## 2020-11-03 MED ORDER — CETIRIZINE HCL 5 MG PO TABS
5.0000 mg | ORAL_TABLET | Freq: Every day | ORAL | 0 refills | Status: AC
Start: 2020-11-03 — End: ?

## 2020-11-03 MED ORDER — ALBUTEROL SULFATE HFA 108 (90 BASE) MCG/ACT IN AERS: 1.0000 | INHALATION_SPRAY | Freq: Four times a day (QID) | RESPIRATORY_TRACT | 0 refills | Status: DC | PRN

## 2020-11-03 MED ORDER — BENZONATATE 100 MG PO CAPS
100.0000 mg | ORAL_CAPSULE | Freq: Three times a day (TID) | ORAL | 0 refills | Status: DC | PRN
Start: 2020-11-03 — End: 2022-03-28

## 2020-11-03 NOTE — ED Triage Notes (Signed)
Cough started last week.  Reports white phlegm intermittently.  Denies fever.  Patient has a slight runny nose.

## 2020-11-03 NOTE — ED Provider Notes (Signed)
Gary Pierce   MRN: [id number] DOB: [birth year]  Subjective:   Gary Pierce is a 80 y.o. male presenting for 1 week history of persistent productive cough, slight runny nose.  Patient has also had occasional intermittent shortness of breath.  Denies fever, chest pain, nausea, vomiting, abdominal pain, throat pain.  Denies history of COPD, smoking, sarcoidosis.  He does have a history of asthma but is not using his inhaler.  Has not tried any medications for relief.  Denies history of heart conditions, chest PE.   Current Facility-Administered Medications:  .  betamethasone acetate-betamethasone sodium phosphate (CELESTONE) injection 3 mg, 3 mg, Intramuscular, Once, Edrick Kins, DPM  Current Outpatient Medications:  .  amLODipine (NORVASC) 10 MG tablet, Take 10 mg by mouth daily., Disp: , Rfl:  .  albuterol (PROVENTIL HFA;VENTOLIN HFA) 108 (90 BASE) MCG/ACT inhaler, Inhale 2 puffs into the lungs every 6 (six) hours as needed for wheezing or shortness of breath., Disp: 1 Inhaler, Rfl: 2 .  loperamide (IMODIUM) 2 MG capsule, Take 1 capsule (2 mg total) by mouth daily as needed for diarrhea or loose stools., Disp: 10 capsule, Rfl: 0 .  ondansetron (ZOFRAN-ODT) 8 MG disintegrating tablet, Take 1 tablet (8 mg total) by mouth every 8 (eight) hours as needed for nausea or vomiting., Disp: 20 tablet, Rfl: 0   Allergies  Allergen Reactions  . Cephalexin Diarrhea    Past Medical History:  Diagnosis Date  . Hypercholesterolemia   . Hypertension      Past Surgical History:  Procedure Laterality Date  . COLONOSCOPY     2015  due in 2017    Family History  Problem Relation Age of Onset  . Cancer Mother   . Heart disease Father   . Hyperlipidemia Father   . Hypertension Father   . Heart attack Father     Social History   Tobacco Use  . Smoking status: Former Smoker    Years: 25.00    Types: Cigarettes    Quit date: 09/18/2012    Years since quitting: 8.1  .  Smokeless tobacco: Never Used  Vaping Use  . Vaping Use: Never used  Substance Use Topics  . Alcohol use: No    Alcohol/week: 0.0 standard drinks  . Drug use: No    ROS   Objective:   Vitals: BP 140/86 (BP Location: Right Arm)   Pulse 61   Temp 98 F (36.7 C) (Oral)   Resp (!) 22   SpO2 100%   Physical Exam Constitutional:      General: He is not in acute distress.    Appearance: Normal appearance. He is well-developed. He is not ill-appearing, toxic-appearing or diaphoretic.  HENT:     Head: Normocephalic and atraumatic.     Right Ear: External ear normal.     Left Ear: External ear normal.     Nose: Nose normal.     Mouth/Throat:     Mouth: Mucous membranes are moist.     Pharynx: Oropharynx is clear.  Eyes:     General: No scleral icterus.       Right eye: No discharge.        Left eye: No discharge.     Extraocular Movements: Extraocular movements intact.     Conjunctiva/sclera: Conjunctivae normal.     Pupils: Pupils are equal, round, and reactive to light.  Cardiovascular:     Rate and Rhythm: Normal rate and regular rhythm.  Heart sounds: Normal heart sounds. No murmur heard. No friction rub. No gallop.   Pulmonary:     Effort: Pulmonary effort is normal. No respiratory distress.     Breath sounds: Normal breath sounds. No stridor. No wheezing, rhonchi or rales.  Neurological:     Mental Status: He is alert and oriented to person, place, and time.  Psychiatric:        Mood and Affect: Mood normal.        Behavior: Behavior normal.        Thought Content: Thought content normal.        Judgment: Judgment normal.     DG Chest 2 View  Result Date: 11/03/2020 CLINICAL DATA:  Cough.  Slight runny nose. EXAM: CHEST - 2 VIEW COMPARISON:  08/15/2015 FINDINGS: Normal heart size. Aortic atherosclerosis. No pleural effusion or edema. No airspace opacities. Degenerative changes noted within the thoracic spine. IMPRESSION: No active cardiopulmonary  abnormalities. Electronically Signed   By: Kerby Moors M.D.   On: 11/03/2020 11:09     Assessment and Plan :   PDMP not reviewed this encounter.  1. Viral URI with cough     Patient declined Covid test, recommended supportive care for viral upper respiratory infection. Counseled patient on potential for adverse effects with medications prescribed/recommended today, ER and return-to-clinic precautions discussed, patient verbalized understanding.    Jaynee Eagles, [place] 11/03/20 9447

## 2021-02-16 ENCOUNTER — Ambulatory Visit: Payer: Medicare Other | Attending: Internal Medicine

## 2021-02-16 ENCOUNTER — Other Ambulatory Visit: Payer: Self-pay

## 2021-02-16 ENCOUNTER — Other Ambulatory Visit (HOSPITAL_BASED_OUTPATIENT_CLINIC_OR_DEPARTMENT_OTHER): Payer: Self-pay

## 2021-02-16 DIAGNOSIS — Z23 Encounter for immunization: Secondary | ICD-10-CM

## 2021-02-16 MED ORDER — PFIZER-BIONT COVID-19 VAC-TRIS 30 MCG/0.3ML IM SUSP
INTRAMUSCULAR | 0 refills | Status: DC
  Filled 2021-02-16: qty 0.3, 1d supply, fill #0

## 2021-02-16 NOTE — Progress Notes (Signed)
   Covid-19 Vaccination Clinic  Name:  Rawlin Reaume    MRN: [id number] DOB: [birth year]  02/16/2021  Mr. Garde was observed post Covid-19 immunization for 15 minutes without incident. He was provided with Vaccine Information Sheet and instruction to access the V-Safe system.   Mr. Burleson was instructed to call 911 with any severe reactions post vaccine: Difficulty breathing  Swelling of face and throat  A fast heartbeat  A bad rash all over body  Dizziness and weakness   Immunizations Administered     Name Date Dose VIS Date Route   PFIZER Comrnaty(Gray TOP) Covid-19 Vaccine 02/16/2021  1:42 PM 0.3 mL 07/21/2020 Intramuscular   Manufacturer: [place]   Lot: [id number]   [place]: [phone]

## 2021-12-30 ENCOUNTER — Emergency Department (HOSPITAL_COMMUNITY): Payer: Medicare Other

## 2021-12-30 ENCOUNTER — Inpatient Hospital Stay (HOSPITAL_COMMUNITY)
Admission: EM | Admit: 2021-12-30 | Discharge: 2022-01-01 | DRG: 684 | Disposition: A | Discharge status: Home / Self Care | Payer: Medicare Other | Attending: Internal Medicine | Admitting: Internal Medicine

## 2021-12-30 ENCOUNTER — Other Ambulatory Visit: Payer: Self-pay

## 2021-12-30 ENCOUNTER — Encounter (HOSPITAL_COMMUNITY): Payer: Self-pay

## 2021-12-30 DIAGNOSIS — I129 Hypertensive chronic kidney disease with stage 1 through stage 4 chronic kidney disease, or unspecified chronic kidney disease: Secondary | ICD-10-CM | POA: Diagnosis present

## 2021-12-30 DIAGNOSIS — N179 Acute kidney failure, unspecified: Secondary | ICD-10-CM | POA: Diagnosis not present

## 2021-12-30 DIAGNOSIS — Z83438 Family history of other disorder of lipoprotein metabolism and other lipidemia: Secondary | ICD-10-CM

## 2021-12-30 DIAGNOSIS — R0609 Other forms of dyspnea: Secondary | ICD-10-CM | POA: Diagnosis present

## 2021-12-30 DIAGNOSIS — E7800 Pure hypercholesterolemia, unspecified: Secondary | ICD-10-CM | POA: Diagnosis present

## 2021-12-30 DIAGNOSIS — J45909 Unspecified asthma, uncomplicated: Secondary | ICD-10-CM | POA: Diagnosis not present

## 2021-12-30 DIAGNOSIS — R079 Chest pain, unspecified: Secondary | ICD-10-CM | POA: Diagnosis not present

## 2021-12-30 DIAGNOSIS — R0789 Other chest pain: Secondary | ICD-10-CM | POA: Diagnosis present

## 2021-12-30 DIAGNOSIS — Z8249 Family history of ischemic heart disease and other diseases of the circulatory system: Secondary | ICD-10-CM

## 2021-12-30 DIAGNOSIS — N400 Enlarged prostate without lower urinary tract symptoms: Secondary | ICD-10-CM | POA: Diagnosis present

## 2021-12-30 DIAGNOSIS — E785 Hyperlipidemia, unspecified: Secondary | ICD-10-CM | POA: Diagnosis present

## 2021-12-30 DIAGNOSIS — E782 Mixed hyperlipidemia: Secondary | ICD-10-CM | POA: Diagnosis present

## 2021-12-30 DIAGNOSIS — Z87891 Personal history of nicotine dependence: Secondary | ICD-10-CM

## 2021-12-30 DIAGNOSIS — M109 Gout, unspecified: Secondary | ICD-10-CM | POA: Diagnosis present

## 2021-12-30 DIAGNOSIS — I10 Essential (primary) hypertension: Secondary | ICD-10-CM | POA: Diagnosis present

## 2021-12-30 DIAGNOSIS — N184 Chronic kidney disease, stage 4 (severe): Secondary | ICD-10-CM

## 2021-12-30 LAB — CBC
HCT: 35.8 % — ABNORMAL LOW (ref 39.0–52.0)
Hemoglobin: 12.4 g/dL — ABNORMAL LOW (ref 13.0–17.0)
MCH: 28.1 pg (ref 26.0–34.0)
MCHC: 34.6 g/dL (ref 30.0–36.0)
MCV: 81 fL (ref 80.0–100.0)
Platelets: 224 10*3/uL (ref 150–400)
RBC: 4.42 MIL/uL (ref 4.22–5.81)
RDW: 14.3 % (ref 11.5–15.5)
WBC: 7.1 10*3/uL (ref 4.0–10.5)
nRBC: 0 % (ref 0.0–0.2)

## 2021-12-30 LAB — BASIC METABOLIC PANEL
Anion gap: 6 (ref 5–15)
BUN: 35 mg/dL — ABNORMAL HIGH (ref 8–23)
CO2: 22 mmol/L (ref 22–32)
Calcium: 8.9 mg/dL (ref 8.9–10.3)
Chloride: 110 mmol/L (ref 98–111)
Creatinine, Ser: 2.87 mg/dL — ABNORMAL HIGH (ref 0.61–1.24)
GFR, Estimated: 21 mL/min — ABNORMAL LOW (ref >60)
Glucose, Bld: 97 mg/dL (ref 70–99)
Potassium: 4.6 mmol/L (ref 3.5–5.1)
Sodium: 138 mmol/L (ref 135–145)

## 2021-12-30 LAB — TROPONIN I (HIGH SENSITIVITY)
Troponin I (High Sensitivity): 7 ng/L (ref <18)
Troponin I (High Sensitivity): 7 ng/L (ref <18)

## 2021-12-30 MED ORDER — ENOXAPARIN SODIUM 30 MG/0.3ML IJ SOSY
30.0000 mg | PREFILLED_SYRINGE | INTRAMUSCULAR | Status: DC
  Administered 2021-12-30 – 2021-12-31 (×2): 30 mg via SUBCUTANEOUS
  Filled 2021-12-30 (×2): qty 0.3

## 2021-12-30 MED ORDER — SODIUM CHLORIDE 0.9 % IV SOLN: INTRAVENOUS | Status: DC

## 2021-12-30 MED ORDER — ALBUTEROL SULFATE HFA 108 (90 BASE) MCG/ACT IN AERS: 1.0000 | INHALATION_SPRAY | Freq: Four times a day (QID) | RESPIRATORY_TRACT | Status: DC | PRN

## 2021-12-30 MED ORDER — LOPERAMIDE HCL 2 MG PO CAPS: 2.0000 mg | ORAL_CAPSULE | Freq: Every day | ORAL | Status: DC | PRN

## 2021-12-30 MED ORDER — AMLODIPINE BESYLATE 10 MG PO TABS
10.0000 mg | ORAL_TABLET | Freq: Every day | ORAL | Status: DC
  Administered 2021-12-31 – 2022-01-01 (×2): 10 mg via ORAL
  Filled 2021-12-30 (×2): qty 1

## 2021-12-30 MED ORDER — LACTATED RINGERS IV BOLUS
1000.0000 mL | Freq: Once | INTRAVENOUS | Status: AC
  Administered 2021-12-30: 1000 mL via INTRAVENOUS

## 2021-12-30 MED ORDER — ALBUTEROL SULFATE (2.5 MG/3ML) 0.083% IN NEBU: 2.5000 mg | INHALATION_SOLUTION | Freq: Four times a day (QID) | RESPIRATORY_TRACT | Status: DC | PRN

## 2021-12-30 MED ORDER — ACETAMINOPHEN 325 MG PO TABS
650.0000 mg | ORAL_TABLET | ORAL | Status: DC | PRN
  Administered 2021-12-31: 650 mg via ORAL
  Filled 2021-12-30: qty 2

## 2021-12-30 MED ORDER — ONDANSETRON HCL 4 MG/2ML IJ SOLN: 4.0000 mg | Freq: Four times a day (QID) | INTRAMUSCULAR | Status: DC | PRN

## 2021-12-30 MED ORDER — PNEUMOCOCCAL 20-VAL CONJ VACC 0.5 ML IM SUSY: 0.5000 mL | PREFILLED_SYRINGE | INTRAMUSCULAR | Status: DC | PRN

## 2021-12-30 NOTE — H&P (Signed)
History and Physical   Gary Pierce OYD:[id number] DOB: March 27, 1941 DOA: 12/30/2021  PCP: Milford Cage, PA   Patient coming from: Home  Chief Complaint: Chest pain  HPI: Gary Pierce is a 81 y.o. male with medical history significant of gout, hypertension, BPH, hyperlipidemia, asthma presenting with ongoing chest pain worse today.  Patient reports 1 month of progressive chest pain on exertion.  First episode occurred about a month ago and he got dizzy as well when getting out of his car.  Since that time he has had chest pain intermittently that is worse on exertion and improved with rest. Pain is associated with SOB and is described as a tightness that radiates down his left arm. He had a severe episode today prompting him to call 911.  He did take an aspirin prior to EMS arrival with improvement in his chest pain.  Currently is chest pain-free.  He denies diaphoresis, nausea.  Further denies fevers, chills, abdominal pain, constipation, diarrhea.  ED Course: Vital signs in the ED significant for heart rate in the 50s.  Lab work-up included BMP with creatinine elevated 2.87 with most recent labs showing creatinine of 1.7 in 2019.  CBC with hemoglobin stable at 12.4.  Troponin normal on first check with recheck pending.  Chest x-ray without acute abnormality.  Patient received a liter of fluids in the ED.  Cardiology was consulted who recommended trending troponin and possible echocardiogram with reconsult as needed.  Review of Systems: As per HPI otherwise all other systems reviewed and are negative.  Past Medical History:  Diagnosis Date   Hypercholesterolemia    Hypertension     Past Surgical History:  Procedure Laterality Date   COLONOSCOPY     2015  due in 2017    Social History  reports that he quit smoking about 9 years ago. His smoking use included cigarettes. He has never used smokeless tobacco. He reports that he does not drink alcohol and does not use drugs.  Allergies   Allergen Reactions   Cephalexin Diarrhea    Family History  Problem Relation Age of Onset   Cancer Mother    Heart disease Father    Hyperlipidemia Father    Hypertension Father    Heart attack Father   Reviewed on admission  Prior to Admission medications   Medication Sig Start Date End Date Taking? Authorizing Provider  albuterol (VENTOLIN HFA) 108 (90 Base) MCG/ACT inhaler Inhale 1-2 puffs into the lungs every 6 (six) hours as needed for wheezing or shortness of breath. 11/03/20   Jaynee Eagles, PA-C  amLODipine (NORVASC) 10 MG tablet Take 10 mg by mouth daily. 10/03/20   [provider]  benzonatate (TESSALON) 100 MG capsule Take 1-2 capsules (100-200 mg total) by mouth 3 (three) times daily as needed. 11/03/20   Jaynee Eagles, PA-C  cetirizine (ZYRTEC) 5 MG tablet Take 1 tablet (5 mg total) by mouth daily. 11/03/20   Jaynee Eagles, PA-C  COVID-19 mRNA Vac-TriS, Pfizer, (PFIZER-BIONT COVID-19 VAC-TRIS) SUSP injection Inject into the muscle. 02/16/21   Carlyle Basques, MD  loperamide (IMODIUM) 2 MG capsule Take 1 capsule (2 mg total) by mouth daily as needed for diarrhea or loose stools. 08/16/20   Jaynee Eagles, PA-C  ondansetron (ZOFRAN-ODT) 8 MG disintegrating tablet Take 1 tablet (8 mg total) by mouth every 8 (eight) hours as needed for nausea or vomiting. 08/16/20   Jaynee Eagles, PA-C    Physical Exam: Vitals:   12/30/21 1845 12/30/21 1846 12/30/21 1847 12/30/21  2000  BP: (!) 129/58   123/60  Pulse: (!) 56   (!) 55  Resp: 18   11  Temp:      TempSrc:      SpO2: 99% 98%  100%  Weight:   90.7 kg   Height:   '5\' 10"'$  (1.778 m)     Physical Exam Constitutional:      General: He is not in acute distress.    Appearance: Normal appearance.  HENT:     Head: Normocephalic and atraumatic.     Mouth/Throat:     Mouth: Mucous membranes are moist.     Pharynx: Oropharynx is clear.  Eyes:     Extraocular Movements: Extraocular movements intact.     Pupils: Pupils are equal, round, and  reactive to light.  Cardiovascular:     Rate and Rhythm: Normal rate and regular rhythm.     Pulses: Normal pulses.     Heart sounds: Normal heart sounds.  Pulmonary:     Effort: Pulmonary effort is normal. No respiratory distress.     Breath sounds: Normal breath sounds.  Abdominal:     General: Bowel sounds are normal. There is no distension.     Palpations: Abdomen is soft.     Tenderness: There is no abdominal tenderness.  Musculoskeletal:        General: No swelling or deformity.  Skin:    General: Skin is warm and dry.  Neurological:     General: No focal deficit present.     Mental Status: Mental status is at baseline.   Labs on Admission: I have personally reviewed following labs and imaging studies  CBC: Recent Labs  Lab 12/30/21 1851  WBC 7.1  HGB 12.4*  HCT 35.8*  MCV 81.0  PLT 415    Basic Metabolic Panel: Recent Labs  Lab 12/30/21 1851  NA 138  K 4.6  CL 110  CO2 22  GLUCOSE 97  BUN 35*  CREATININE 2.87*  CALCIUM 8.9    GFR: Estimated Creatinine Clearance: 22.5 mL/min (A) (by C-G formula based on SCr of 2.87 mg/dL (H)).  Liver Function Tests: No results for input(s): AST, ALT, ALKPHOS, BILITOT, PROT, ALBUMIN in the last 168 hours.  Urine analysis: No results found for: COLORURINE, APPEARANCEUR, [place], [place], GLUCOSEU, [place], BILIRUBINUR, KETONESUR, PROTEINUR, UROBILINOGEN, NITRITE, LEUKOCYTESUR  Radiological Exams on Admission: DG Chest 2 View  Result Date: 12/30/2021 CLINICAL DATA:  Chest pain EXAM: CHEST - 2 VIEW COMPARISON:  11/03/2020 FINDINGS: Lungs are well expanded, symmetric, and clear. No pneumothorax or pleural effusion. Cardiac size within normal limits. Pulmonary vascularity is normal. Osseous structures are age-appropriate. No acute bone abnormality. IMPRESSION: No active cardiopulmonary disease. Electronically Signed   By: Fidela Salisbury M.D.   On: 12/30/2021 19:25    EKG: Independently reviewed.  Sinus rhythm at 53 bpm.   Bigeminy noted.  Rate slower than previous in 2017 ordered by GI in the referral previous in 2017.  Assessment/Plan Principal Problem:   Chest pain Active Problems:   Essential hypertension   BPH (benign prostatic hyperplasia)   Hyperlipidemia   Asthma, chronic   Chest pain > Patient presenting with ongoing chest pain worse with exertion and relieved by rest for the past month likely exertional angina.  Severe episode today prompting call 911 improvement after taking aspirin.  Initial troponin normal in the ED with repeat pending. > Cardiology consulted and recommend trending troponin and reconsult as needed.  They also state to consider echocardiogram, however.  Did not feel there is indication for provocative testing at this time. - Monitor on telemetry - Appreciate cardiology recommendations, will reconsult if needed - Trend troponin - Echocardiogram - If not done this admission, patient will benefit from referral to cardiology on discharge for provocative testing, states he would like to be seen by someone within [facility].  AKI on CKD?  vs CKD > Patient noted to have creatinine of 2.87 with unknown baseline.  Previous lab work available in chart review showed creatinine of 1.7 in 2019. > Unclear if this represents an AKI versus progression of CKD. > Received a liter of fluids in the ED, will continue with IV fluids overnight. - Continue with IV fluids overnight - Trend renal function and electrolytes - Avoid nephrotoxic agents  Hypertension - Continue home amlodipine  Asthma - Continue home albuterol as needed  Hyperlipidemia Gout BPH - No listed medications for these issues, will wait for pharmacy med rec  DVT prophylaxis: Lovenox Code Status:   Still Family Communication:  None on admission, patient at this family is aware of his admission. Disposition Plan:   Patient is from:  Home  Anticipated DC to:  Home   Anticipated DC date:  1 to 2 days  Anticipated DC  barriers: None  Consults called:  Cardiology, consulted in the ED, gave EDP verbal recs and are not seeing the patient Admission status:  Observation, telemetry  Severity of Illness: The appropriate patient status for this patient is OBSERVATION. Observation status is judged to be reasonable and necessary in order to provide the required intensity of service to ensure the patient's safety. The patient's presenting symptoms, physical exam findings, and initial radiographic and laboratory data in the context of their medical condition is felt to place them at decreased risk for further clinical deterioration. Furthermore, it is anticipated that the patient will be medically stable for discharge from the hospital within 2 midnights of admission.    Marcelyn Bruins MD [facility]  How to contact the [facility] Attending or Consulting provider [place] or covering provider during after hours [place], for this patient?   Check the care team in [facility] and look for a) attending/consulting [facility] provider listed and b) the [facility] team listed Log into [email or web address] and use [place] universal password to access. If you do not have the password, please contact the hospital operator. Locate the [facility] provider you are looking for under [facility] and page to a number that you can be directly reached. If you still have difficulty reaching the provider, please page the [facility] (Director on Call) for the Hospitalists listed on amion for assistance.  12/30/2021, 9:09 PM

## 2021-12-30 NOTE — ED Provider Notes (Signed)
[facility] Provider Note   CSN: [id number] Arrival date & time: 12/30/21  1827     History  Chief Complaint  Patient presents with   Chest Pain    Gary Pierce is a 81 y.o. male.  HPI 81 year old male presents with progressive chest tightness over the past month or so.  He states when he got out of the car a month ago he felt dizzy he has been having chest symptoms ever since.  Gets chest tightness whenever he walks a small distance.  This has been stably occurring ever since.  Today was at its worst when he was at his daughter's mother's house and so he was told to call 911.  He took some aspirin just prior to EMS arrival and the tightness went away.  He denies any radiation of the pain such as to his arm, back, etc.  Currently he is asymptomatic.  He has chronic left lower extremity swelling that is unchanged.  No new swelling.  Home Medications Prior to Admission medications   Medication Sig Start Date End Date Taking? Authorizing Provider  albuterol (VENTOLIN HFA) 108 (90 Base) MCG/ACT inhaler Inhale 1-2 puffs into the lungs every 6 (six) hours as needed for wheezing or shortness of breath. 11/03/20  Yes Jaynee Eagles, PA-C  alfuzosin (UROXATRAL) 10 MG 24 hr tablet Take 10 mg by mouth daily. 10/07/21  Yes [provider]  amLODipine (NORVASC) 10 MG tablet Take 10 mg by mouth daily. 10/03/20  Yes [provider]  benzonatate (TESSALON) 100 MG capsule Take 1-2 capsules (100-200 mg total) by mouth 3 (three) times daily as needed. Patient taking differently: Take 100-200 mg by mouth 3 (three) times daily as needed for cough. 11/03/20  Yes Jaynee Eagles, PA-C  cetirizine (ZYRTEC) 5 MG tablet Take 1 tablet (5 mg total) by mouth daily. 11/03/20  Yes Jaynee Eagles, PA-C  clotrimazole-betamethasone (LOTRISONE) cream Apply 1 application. topically 2 (two) times daily as needed (fungal infections). 10/23/21  Yes [provider]  colchicine  0.6 MG tablet Take 0.6 mg by mouth 4 (four) times daily as needed (gout). 10/23/21  Yes [provider]  loperamide (IMODIUM) 2 MG capsule Take 1 capsule (2 mg total) by mouth daily as needed for diarrhea or loose stools. 08/16/20  Yes Jaynee Eagles, PA-C  meloxicam (MOBIC) 15 MG tablet Take 15 mg by mouth daily. 08/18/21  Yes [provider]  metroNIDAZOLE (METROGEL) 1 % gel Apply 1 application. topically daily. 10/23/21  Yes [provider]  olmesartan (BENICAR) 40 MG tablet Take 40 mg by mouth daily. 10/10/21  Yes [provider]  ondansetron (ZOFRAN-ODT) 8 MG disintegrating tablet Take 1 tablet (8 mg total) by mouth every 8 (eight) hours as needed for nausea or vomiting. 08/16/20  Yes Jaynee Eagles, PA-C  silodosin (RAPAFLO) 8 MG CAPS capsule Take 8 mg by mouth daily. 10/10/21  Yes [provider]  zolpidem (AMBIEN) 10 MG tablet Take 10 mg by mouth at bedtime as needed for sleep. 12/30/21  Yes [provider]  COVID-19 mRNA Vac-TriS, Pfizer, (PFIZER-BIONT COVID-19 VAC-TRIS) SUSP injection Inject into the muscle. 02/16/21   Carlyle Basques, MD      Allergies    Cephalexin    Review of Systems   Review of Systems  Respiratory:  Positive for chest tightness and shortness of breath.   Cardiovascular:  Positive for chest pain and leg swelling (chronic LLE).  Gastrointestinal:  Negative for abdominal pain.  Musculoskeletal:  Negative for back pain.   Physical Exam Updated Vital Signs BP 123/60   Pulse (!) 55   Temp 97.9 F (36.6 C) (Oral)   Resp 11   Ht $R'5\' 10"'eL$  (1.778 m)   Wt 90.7 kg   SpO2 100%   BMI 28.69 kg/m  Physical Exam Vitals and nursing note reviewed.  Constitutional:      General: He is not in acute distress.    Appearance: He is well-developed. He is not ill-appearing or diaphoretic.  HENT:     Head: Normocephalic and atraumatic.  Cardiovascular:     Rate and Rhythm: Regular rhythm. Bradycardia present.     Pulses:           Radial pulses are 2+ on the right side and 2+ on the left side.     Heart sounds: Normal heart sounds.  Pulmonary:     Effort: Pulmonary effort is normal.     Breath sounds: Normal breath sounds.  Abdominal:     Palpations: Abdomen is soft.     Tenderness: There is no abdominal tenderness.  Skin:    General: Skin is warm and dry.  Neurological:     Mental Status: He is alert.    ED Results / Procedures / Treatments   Labs (all labs ordered are listed, but only abnormal results are displayed) Labs Reviewed  BASIC METABOLIC PANEL - Abnormal; Notable for the following components:      Result Value   BUN 35 (*)    Creatinine, Ser 2.87 (*)    GFR, Estimated 21 (*)    All other components within normal limits  CBC - Abnormal; Notable for the following components:   Hemoglobin 12.4 (*)    HCT 35.8 (*)    All other components within normal limits  TROPONIN I (HIGH SENSITIVITY)  TROPONIN I (HIGH SENSITIVITY)    EKG EKG Interpretation  Date/Time:  Saturday Dec 30 2021 18:42:16 EDT Ventricular Rate:  53 PR Interval:  215 QRS Duration: 104 QT Interval:  415 QTC Calculation: 390 R Axis:   -36 Text Interpretation: Sinus rhythm Supraventricular bigeminy Borderline prolonged PR interval Left axis deviation Abnormal R-wave progression, early transition Confirmed by Sherwood Gambler [phone]) on 12/30/2021 6:46:00 PM  Radiology DG Chest 2 View  Result Date: 12/30/2021 CLINICAL DATA:  Chest pain EXAM: CHEST - 2 VIEW COMPARISON:  11/03/2020 FINDINGS: Lungs are well expanded, symmetric, and clear. No pneumothorax or pleural effusion. Cardiac size within normal limits. Pulmonary vascularity is normal. Osseous structures are age-appropriate. No acute bone abnormality. IMPRESSION: No active cardiopulmonary disease. Electronically Signed   By: Fidela Salisbury M.D.   On: 12/30/2021 19:25    Procedures Procedures    Medications Ordered in ED Medications  amLODipine (NORVASC) tablet 10 mg (has  no administration in time range)  loperamide (IMODIUM) capsule 2 mg (has no administration in time range)  acetaminophen (TYLENOL) tablet 650 mg (has no administration in time range)  ondansetron (ZOFRAN) injection 4 mg (has no administration in time range)  enoxaparin (LOVENOX) injection 30 mg (30 mg Subcutaneous Given 12/30/21 2203)  0.9 %  sodium chloride infusion ( Intravenous New Bag/Given 12/30/21 2121)  albuterol (PROVENTIL) (2.5 MG/3ML) 0.083% nebulizer solution 2.5 mg (has no administration in time range)  lactated ringers bolus 1,000 mL (0 mLs Intravenous Stopped 12/30/21 2204)    ED Course/ Medical Decision Making/ A&P   HEAR Score: 5  Medical Decision Making Amount and/or Complexity of Data Reviewed External Data Reviewed: notes. Labs: ordered. Radiology: ordered and independent interpretation performed. ECG/medicine tests: ordered and independent interpretation performed.  Risk Decision regarding hospitalization.   Patient presents with exertional chest pain.  Currently is having no pain.  It was worse today but otherwise has not been escalating over the past month.  Troponin is negative.  ECG does not show any acute ischemia on my interpretation.  Chest x-ray images viewed by myself and there is no edema or pneumonia.  However his creatinine is significantly elevated at 2.7.  He has had slowly worsening creatinine according to chart review but no labs available since 2019.  Thus is unclear what acuity this is.  I did briefly discussed with cardiology, and at this point he does not really need an emergent catheterization given he is not having chest pain at rest or escalating symptoms.  If second troponin becomes positive this would change things.  However with the kidney disease I think will need admission and further evaluation.  Discussed with Dr. Trilby Drummer.        Final Clinical Impression(s) / ED Diagnoses Final diagnoses:  Exertional chest pain   Acute kidney injury [facility])    Rx / DC Orders ED Discharge Orders     None         Sherwood Gambler, MD 12/30/21 2254

## 2021-12-30 NOTE — ED Triage Notes (Signed)
Pt arrived via [facility] from home for intermittent chest pain for 1 month. Pt states he gets left sided chest tightness that radiates down left arm and SOB w/exertion. Pt states he also gets it at night. PT took ASA $Remove'324mg'BcMVgpv$ . VSS. Pt is eupneic.

## 2021-12-31 ENCOUNTER — Observation Stay (HOSPITAL_COMMUNITY): Payer: Medicare Other

## 2021-12-31 ENCOUNTER — Other Ambulatory Visit: Payer: Self-pay | Admitting: Cardiology

## 2021-12-31 DIAGNOSIS — N400 Enlarged prostate without lower urinary tract symptoms: Secondary | ICD-10-CM | POA: Diagnosis present

## 2021-12-31 DIAGNOSIS — Z83438 Family history of other disorder of lipoprotein metabolism and other lipidemia: Secondary | ICD-10-CM | POA: Diagnosis not present

## 2021-12-31 DIAGNOSIS — R0609 Other forms of dyspnea: Secondary | ICD-10-CM | POA: Diagnosis present

## 2021-12-31 DIAGNOSIS — N184 Chronic kidney disease, stage 4 (severe): Secondary | ICD-10-CM

## 2021-12-31 DIAGNOSIS — N179 Acute kidney failure, unspecified: Secondary | ICD-10-CM

## 2021-12-31 DIAGNOSIS — M109 Gout, unspecified: Secondary | ICD-10-CM | POA: Diagnosis present

## 2021-12-31 DIAGNOSIS — R072 Precordial pain: Secondary | ICD-10-CM | POA: Diagnosis not present

## 2021-12-31 DIAGNOSIS — Z87891 Personal history of nicotine dependence: Secondary | ICD-10-CM | POA: Diagnosis not present

## 2021-12-31 DIAGNOSIS — Z8249 Family history of ischemic heart disease and other diseases of the circulatory system: Secondary | ICD-10-CM | POA: Diagnosis not present

## 2021-12-31 DIAGNOSIS — R079 Chest pain, unspecified: Secondary | ICD-10-CM | POA: Diagnosis present

## 2021-12-31 DIAGNOSIS — E7800 Pure hypercholesterolemia, unspecified: Secondary | ICD-10-CM | POA: Diagnosis present

## 2021-12-31 DIAGNOSIS — R0789 Other chest pain: Secondary | ICD-10-CM | POA: Diagnosis present

## 2021-12-31 DIAGNOSIS — J45909 Unspecified asthma, uncomplicated: Secondary | ICD-10-CM | POA: Diagnosis present

## 2021-12-31 DIAGNOSIS — I129 Hypertensive chronic kidney disease with stage 1 through stage 4 chronic kidney disease, or unspecified chronic kidney disease: Secondary | ICD-10-CM | POA: Diagnosis present

## 2021-12-31 LAB — BASIC METABOLIC PANEL
Anion gap: 7 (ref 5–15)
BUN: 30 mg/dL — ABNORMAL HIGH (ref 8–23)
CO2: 20 mmol/L — ABNORMAL LOW (ref 22–32)
Calcium: 8.4 mg/dL — ABNORMAL LOW (ref 8.9–10.3)
Chloride: 113 mmol/L — ABNORMAL HIGH (ref 98–111)
Creatinine, Ser: 2.21 mg/dL — ABNORMAL HIGH (ref 0.61–1.24)
GFR, Estimated: 29 mL/min — ABNORMAL LOW (ref >60)
Glucose, Bld: 90 mg/dL (ref 70–99)
Potassium: 4.5 mmol/L (ref 3.5–5.1)
Sodium: 140 mmol/L (ref 135–145)

## 2021-12-31 LAB — ECHOCARDIOGRAM COMPLETE
AR max vel: 2.05 cm2
AV Mean grad: 12 mmHg
AV Peak grad: 12.4 mmHg
Ao pk vel: 1.76 m/s
Area-P 1/2: 3.48 cm2
Calc EF: 63.4 %
Height: 70 in
S' Lateral: 2.5 cm
Single Plane A2C EF: 62.8 %
Single Plane A4C EF: 63.4 %
Weight: 3212.8 oz

## 2021-12-31 LAB — HEMOGLOBIN A1C
Hgb A1c MFr Bld: 5.9 % — ABNORMAL HIGH (ref 4.8–5.6)
Mean Plasma Glucose: 122.63 mg/dL

## 2021-12-31 LAB — LIPID PANEL
Cholesterol: 223 mg/dL — ABNORMAL HIGH (ref 0–200)
HDL: 38 mg/dL — ABNORMAL LOW (ref >40)
LDL Cholesterol: 147 mg/dL — ABNORMAL HIGH (ref 0–99)
Total CHOL/HDL Ratio: 5.9 RATIO
Triglycerides: 191 mg/dL — ABNORMAL HIGH (ref <150)
VLDL: 38 mg/dL (ref 0–40)

## 2021-12-31 LAB — SODIUM, URINE, RANDOM: Sodium, Ur: 110 mmol/L

## 2021-12-31 LAB — OSMOLALITY, URINE: Osmolality, Ur: 539 mOsm/kg (ref 300–900)

## 2021-12-31 LAB — BRAIN NATRIURETIC PEPTIDE: B Natriuretic Peptide: 74.7 pg/mL (ref 0.0–100.0)

## 2021-12-31 LAB — OSMOLALITY: Osmolality: 297 mOsm/kg — ABNORMAL HIGH (ref 275–295)

## 2021-12-31 LAB — CREATININE, URINE, RANDOM: Creatinine, Urine: 133.11 mg/dL

## 2021-12-31 MED ORDER — SODIUM CHLORIDE 0.9 % IV SOLN: INTRAVENOUS | Status: AC

## 2021-12-31 MED ORDER — ISOSORB DINITRATE-HYDRALAZINE 20-37.5 MG PO TABS
1.0000 | ORAL_TABLET | Freq: Three times a day (TID) | ORAL | Status: DC
  Administered 2021-12-31 – 2022-01-01 (×3): 1 via ORAL
  Filled 2021-12-31 (×3): qty 1

## 2021-12-31 MED ORDER — ASPIRIN 81 MG PO CHEW
81.0000 mg | CHEWABLE_TABLET | Freq: Every day | ORAL | Status: DC
  Administered 2021-12-31 – 2022-01-01 (×2): 81 mg via ORAL
  Filled 2021-12-31 (×2): qty 1

## 2021-12-31 MED ORDER — HYDRALAZINE HCL 20 MG/ML IJ SOLN: 10.0000 mg | Freq: Four times a day (QID) | INTRAMUSCULAR | Status: DC | PRN

## 2021-12-31 NOTE — Consult Note (Signed)
CARDIOLOGY CONSULT NOTE  Patient ID: Gary Pierce MRN: [id number] DOB/AGE: [birth year] 81 y.o.  Admit date: 12/30/2021 Referring Physician: [facility] hospital[facility] Reason for Consultation:  Chest pain  HPI:   81 y.o. 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  He lives alone, has a daughter who lives in town.  Patient is active and performs all his daily activities by himself.  His visit to the hospital was mainly prompted by his daughter, with noticed that patient has had exertional dyspnea for several months now.  He denies any chest pain or chest tightness, but gets short of breath-for example walking down the hallway.  He did not have similar complaints today, however.  Blood pressure is elevated.  He takes meloxicam daily for hip pain.  Creatinine 2.2.  He endorses bilateral ankle edema.  He is a former smoker.  Work-up ruled out ACS with normal troponin, as well as normal BNP.   Past Medical History:  Diagnosis Date   Hypercholesterolemia    Hypertension      Past Surgical History:  Procedure Laterality Date   COLONOSCOPY     2015  due in 2017      Family History  Problem Relation Age of Onset   Cancer Mother    Heart disease Father    Hyperlipidemia Father    Hypertension Father    Heart attack Father      Social History: Social History   Socioeconomic History   Marital status: Legally Separated    Spouse name: Not on file   Number of children: Not on file   Years of education: Not on file   Highest education level: Not on file  Occupational History   Not on file  Tobacco Use   Smoking status: Former    Years: 25.00    Types: Cigarettes    Quit date: 09/18/2012    Years since quitting: 9.2   Smokeless tobacco: Never  Vaping Use   Vaping Use: Never used  Substance and Sexual Activity   Alcohol use: No     Alcohol/week: 0.0 standard drinks   Drug use: No   Sexual activity: Not on file  Other Topics Concern   Not on file  Social History Narrative   Not on file   Social Determinants of Health   Financial Resource Strain: Not on file  Food Insecurity: Not on file  Transportation Needs: Not on file  Physical Activity: Not on file  Stress: Not on file  Social Connections: Not on file  Intimate Partner Violence: Not on file     Facility-Administered Medications Prior to Admission  Medication Dose Route Frequency Provider Last Rate Last Admin   betamethasone acetate-betamethasone sodium phosphate (CELESTONE) injection 3 mg  3 mg Intramuscular Once Edrick Kins, DPM       Medications Prior to Admission  Medication Sig Dispense Refill Last Dose   albuterol (VENTOLIN HFA) 108 (90 Base) MCG/ACT inhaler Inhale 1-2 puffs into the lungs every 6 (six) hours as needed for wheezing or shortness of breath. 18 g 0 Past Week   alfuzosin (UROXATRAL) 10 MG 24 hr tablet Take 10 mg by mouth daily.   Past Month   amLODipine (NORVASC) 10 MG tablet Take 10 mg by mouth daily.   12/30/2021   benzonatate (TESSALON) 100 MG capsule Take 1-2 capsules (100-200 mg total) by mouth 3 (  three) times daily as needed. (Patient taking differently: Take 100-200 mg by mouth 3 (three) times daily as needed for cough.) 60 capsule 0 12/29/2021   cetirizine (ZYRTEC) 5 MG tablet Take 1 tablet (5 mg total) by mouth daily. 30 tablet 0 12/30/2021   clotrimazole-betamethasone (LOTRISONE) cream Apply 1 application. topically 2 (two) times daily as needed (fungal infections).   Past Month   colchicine 0.6 MG tablet Take 0.6 mg by mouth 4 (four) times daily as needed (gout).   Past Month   loperamide (IMODIUM) 2 MG capsule Take 1 capsule (2 mg total) by mouth daily as needed for diarrhea or loose stools. 10 capsule 0 12/30/2021   meloxicam (MOBIC) 15 MG tablet Take 15 mg by mouth daily.   Past Week   metroNIDAZOLE (METROGEL) 1 % gel Apply 1  application. topically daily.   Past Week   olmesartan (BENICAR) 40 MG tablet Take 40 mg by mouth daily.   12/30/2021   ondansetron (ZOFRAN-ODT) 8 MG disintegrating tablet Take 1 tablet (8 mg total) by mouth every 8 (eight) hours as needed for nausea or vomiting. 20 tablet 0 Past Week   silodosin (RAPAFLO) 8 MG CAPS capsule Take 8 mg by mouth daily.   Past Month   zolpidem (AMBIEN) 10 MG tablet Take 10 mg by mouth at bedtime as needed for sleep.   12/30/2021   COVID-19 mRNA Vac-TriS, Pfizer, (PFIZER-BIONT COVID-19 VAC-TRIS) SUSP injection Inject into the muscle. 0.3 mL 0     Review of Systems  Cardiovascular:  Positive for dyspnea on exertion and leg swelling. Negative for chest pain, palpitations and syncope.     Physical Exam: Physical Exam Vitals and nursing note reviewed.  Constitutional:      General: He is not in acute distress. Neck:     Vascular: No JVD.  Cardiovascular:     Rate and Rhythm: Normal rate and regular rhythm.     Heart sounds: Normal heart sounds. No murmur heard. Pulmonary:     Effort: Pulmonary effort is normal.     Breath sounds: Normal breath sounds. No wheezing or rales.  Musculoskeletal:     Right lower leg: Edema (1+) present.     Left lower leg: Edema (1+) present.       Imaging/tests reviewed and independently interpreted: Lab Results: CBC, BMP, BNP  Cardiac Studies:  Telemetry 12/31/2021: No significant arrhythmia  EKG 12/30/2021: Sinus rhythm 58 bpm Occasional PAC Possible old anteroseptal infarct  Echocardiogram: Pending  Assessment & Recommendations:  81 y.o. African-American male  with hypertension, hyperlipidemia, CKD stage IV, former smoker, presented with exertional dyspnea  Exertional dyspnea: Likely combination of uncontrolled hypertension, possibly HFpEF, as well as angina equivalent. Echocardiogram pending. Recommend outpatient exercise nuclear stress test. Added BiDil 20/37.5 mg 3 times daily for management of  hypertension. Given his CKD, would like to avoid invasive work-up, as far as possible. For the same reason, recommend stopping meloxicam.  Okay to take aspirin 81 mg daily for now. We will check lipid panel and A1c.  Pending echocardiogram, okay to discharge unless significant abnormalities found.  We will arrange outpatient exercise nuclear stress test and follow-up.  Discussed interpretation of tests and management recommendations with the primary team     Nigel Mormon, MD Pager: [phone] Office: [phone]

## 2021-12-31 NOTE — Progress Notes (Signed)
PROGRESS NOTE                                                                                                                                                                                                             Patient Demographics:    Gary Pierce, is a 81 y.o. male, DOB - 1941/06/21, FXT:[id number]  Outpatient Primary MD for the patient is Milford Cage, PA    LOS - 0  Admit date - 12/30/2021    Chief Complaint  Patient presents with   Chest Pain       Brief Narrative (HPI from H&P)   81 y.o. male with medical history significant of gout, hypertension, BPH, hyperlipidemia, asthma presenting with ongoing chest pain worse today.    Subjective:    [name] today has, No headache, No chest pain, No abdominal pain - No Nausea, No new weakness tingling or numbness, no SOB   Assessment  & Plan :    Nonspecific chest pain.  With some exertional component.  Thankfully EKG is nonacute and troponin stayed negative, currently chest pain-free, placed on baby aspirin, seen by cardiologist.  Undergoing echocardiogram.  If echo stable likely outpatient follow-up with cardiologist Dr. Virgina Jock.  AKI on CKD 4.  Baseline creatinine 2 years ago around 2.  Upon admission creatinine of 3, with hydration creatinine is improving continue IV fluids check renal ultrasound and monitor.  Hypertension.  On Norvasc.  Blood pressure slightly on the higher side will add hydralazine, Imdur and monitor.  Heart rate slightly on the lower side.  Asthma.  Stable no acute issues.        Condition - Extremely Guarded  Family Communication  :  daughter [name] [phone] on  12/31/21  Code Status :  Full  Consults  :  Cards  PUD Prophylaxis :    Procedures  :     TTE  Renal US      Disposition Plan  :    Status is: Observation  DVT Prophylaxis  :    enoxaparin (LOVENOX) injection 30 mg Start: 12/30/21 2200  Lab  Results  Component Value Date   PLT 224 12/30/2021    Diet :  Diet Order             Diet Heart Room service appropriate? Yes; Fluid consistency: Thin  Diet  effective now                    Inpatient Medications  Scheduled Meds:  amLODipine  10 mg Oral Daily   enoxaparin (LOVENOX) injection  30 mg Subcutaneous Q24H   isosorbide-hydrALAZINE  1 tablet Oral TID   Continuous Infusions:  sodium chloride 50 mL/hr at 12/31/21 0628   PRN Meds:.acetaminophen, albuterol, hydrALAZINE, loperamide, ondansetron (ZOFRAN) IV, pneumococcal 20-valent conjugate vaccine  Antibiotics  :    Anti-infectives (From admission, onward)    None        Time Spent in minutes  30   Lala Lund M.D on 12/31/2021 at 10:04 AM  To page go to [email or web address]   [facility] -  Office  [phone]  See all Orders from today for further details    Objective:   Vitals:   12/30/21 2300 12/30/21 2339 12/31/21 0356 12/31/21 0731  BP: (!) 155/77 (!) 177/88 (!) 153/75 (!) 155/87  Pulse: 66 68  (!) 58  Resp: (!) $RemoveB'21 20 19 19  'pDJAaEdF$ Temp: 97.7 F (36.5 C) 97.6 F (36.4 C) 97.6 F (36.4 C) (!) 97.3 F (36.3 C)  TempSrc: Oral Oral Oral Oral  SpO2: 99% 100% 100% 98%  Weight:  91.1 kg    Height:        Wt Readings from Last 3 Encounters:  12/30/21 91.1 kg  07/01/17 90.7 kg  08/15/15 101.4 kg     Intake/Output Summary (Last 24 hours) at 12/31/2021 1004 Last data filed at 12/31/2021 0834 Gross per 24 hour  Intake 1344.91 ml  Output 400 ml  Net 944.91 ml     Physical Exam  Awake Alert, No new F.N deficits, Normal affect [place].AT,PERRAL Supple Neck, No JVD,   Symmetrical Chest wall movement, Good air movement bilaterally, CTAB RRR,No Gallops,Rubs or new Murmurs,  +ve B.Sounds, Abd Soft, No tenderness,   No Cyanosis, Clubbing or edema      Data Review:    CBC Recent Labs  Lab 12/30/21 1851  WBC 7.1  HGB 12.4*  HCT 35.8*  PLT 224  MCV 81.0  MCH 28.1  MCHC 34.6  RDW  14.3    Electrolytes Recent Labs  Lab 12/30/21 1851 12/31/21 0634  NA 138 140  K 4.6 4.5  CL 110 113*  CO2 22 20*  GLUCOSE 97 90  BUN 35* 30*  CREATININE 2.87* 2.21*  CALCIUM 8.9 8.4*  BNP  --  74.7    ------------------------------------------------------------------------------------------------------------------ No results for input(s): CHOL, HDL, LDLCALC, TRIG, CHOLHDL, LDLDIRECT in the last 72 hours.  Lab Results  Component Value Date   HGBA1C 6.0 (H) 07/19/2014    Radiology Reports DG Chest 2 View  Result Date: 12/30/2021 CLINICAL DATA:  Chest pain EXAM: CHEST - 2 VIEW COMPARISON:  11/03/2020 FINDINGS: Lungs are well expanded, symmetric, and clear. No pneumothorax or pleural effusion. Cardiac size within normal limits. Pulmonary vascularity is normal. Osseous structures are age-appropriate. No acute bone abnormality. IMPRESSION: No active cardiopulmonary disease. Electronically Signed   By: Fidela Salisbury M.D.   On: 12/30/2021 19:25   DG Chest Port 1 View  Result Date: 12/31/2021 CLINICAL DATA:  81 year old male with chest pain and shortness of breath. EXAM: PORTABLE CHEST 1 VIEW COMPARISON:  Chest radiographs 12/30/2021 and earlier. FINDINGS: Portable AP semi upright view at 0730 hours. Lower lung volumes. Mediastinal contours remain normal. Mild crowding of lung markings at the lung bases more so the right. No pneumothorax, pulmonary edema, pleural effusion, or consolidation.  Visualized tracheal air column is within normal limits. No acute osseous abnormality identified. Paucity of bowel gas now in the upper abdomen. IMPRESSION: Lower lung volumes with atelectasis. No other acute cardiopulmonary abnormality. Electronically Signed   By: Genevie Ann M.D.   On: 12/31/2021 07:59

## 2021-12-31 NOTE — Progress Notes (Signed)
HR went down to low 30's, pt asleep, after few seconds back to normal. Will keep monitoring

## 2021-12-31 NOTE — Progress Notes (Signed)
PT Cancellation Note and Discharge  Patient Details Name: Marin Milley MRN: [id number] DOB: Jun 06, 1941   Cancelled Treatment:    Reason Eval/Treat Not Completed: PT screened, no needs identified, will sign off. Discussed pt case with OT who reports pt is currently back to baseline of function. A formal PT evaluation is not warranted at this time. Will sign off, if needs change please reconsult.    Thelma Comp 12/31/2021, 9:06 AM  Rolinda Roan, PT, DPT Acute Rehabilitation Services Secure Chat Preferred Office: [phone]

## 2021-12-31 NOTE — Evaluation (Signed)
Occupational Therapy Evaluation Patient Details Name: Gary Pierce MRN: [id number] DOB: 1941/07/21 Today's Date: 12/31/2021   History of Present Illness Pt is an 81 y/o male who presents witih worsening chest pain on exertion relieved by rest. Admitted under observation to trend troponins. PMH significant for gout, HTN, BPH, asthma.   Clinical Impression   Pt was evaluated s/p the above admission list. He is indep at baseline including driving, and intermittently uses a SPC as needed. He lives alone in an [place] apartment, level entry no steps. Upon evaluation pt demonstrated independent ADLs and mobility with good activity tolerance. Dicussed some energy conservation strategies to uses as needed at c/d. Pt does not have acute or follow up OT needs, will sign off.      Recommendations for follow up therapy are one component of a multi-disciplinary discharge planning process, led by the attending physician.  Recommendations may be updated based on patient status, additional functional criteria and insurance authorization.   Follow Up Recommendations  No OT follow up    Assistance Recommended at Discharge None  Patient can return home with the following Other (comment) (n/a)    Functional Status Assessment  Patient has had a recent decline in their functional status and demonstrates the ability to make significant improvements in function in a reasonable and predictable amount of time.  Equipment Recommendations  None recommended by OT    Recommendations for Other Services       Precautions / Restrictions Precautions Precautions: Fall Restrictions Weight Bearing Restrictions: No      Mobility Bed Mobility Overal bed mobility: Independent                  Transfers Overall transfer level: Independent                        Balance Overall balance assessment: No apparent balance deficits (not formally assessed)                                          ADL either performed or assessed with clinical judgement   ADL Overall ADL's : Independent                                             Vision Baseline Vision/History: 0 No visual deficits Vision Assessment?: No apparent visual deficits     Perception     Praxis      Pertinent Vitals/Pain Pain Assessment Pain Assessment: No/denies pain     Hand Dominance Right   Extremity/Trunk Assessment Upper Extremity Assessment Upper Extremity Assessment: Overall WFL for tasks assessed   Lower Extremity Assessment Lower Extremity Assessment: Overall WFL for tasks assessed   Cervical / Trunk Assessment Cervical / Trunk Assessment: Normal   Communication Communication Communication: No difficulties   Cognition Arousal/Alertness: Awake/alert Behavior During Therapy: WFL for tasks assessed/performed Overall Cognitive Status: Within Functional Limits for tasks assessed                                       General Comments  VSS on RA    Exercises     Shoulder Instructions      Home Living  Family/patient expects to be discharged to:: Private residence Living Arrangements: Alone Available Help at Discharge: Family;Available PRN/intermittently Type of Home: Independent living facility Home Access: Level entry     Home Layout: One level     Bathroom Shower/Tub: Teacher, early years/pre: Handicapped height     Home Equipment: [place] - single point   Additional Comments: ILF      Prior Functioning/Environment Prior Level of Function : Independent/Modified Independent;Driving             Mobility Comments: SPC intermittenetly ADLs Comments: indep        OT Problem List: Decreased activity tolerance      OT Treatment/Interventions:      OT Goals(Current goals can be found in the care plan section) Acute Rehab OT Goals Patient Stated Goal: home OT Goal Formulation: With patient Time For Goal Achievement:  12/31/21 Potential to Achieve Goals: Good    AM-PAC OT "6 Clicks" Daily Activity     Outcome Measure Help from another person eating meals?: None Help from another person taking care of personal grooming?: None Help from another person toileting, which includes using toliet, bedpan, or urinal?: None Help from another person bathing (including washing, rinsing, drying)?: None Help from another person to put on and taking off regular upper body clothing?: None Help from another person to put on and taking off regular lower body clothing?: None 6 Click Score: 24   End of Session Nurse Communication: Mobility status  Activity Tolerance: Patient tolerated treatment well Patient left: in chair;with call bell/phone within reach  OT Visit Diagnosis: Muscle weakness (generalized) (M62.81)                Time: 0104-0459 OT Time Calculation (min): 15 min Charges:  OT General Charges $OT Visit: 1 Visit OT Evaluation $OT Eval Low Complexity: 1 Low   Aribelle Mccosh A Marci Polito 12/31/2021, 9:19 AM

## 2021-12-31 NOTE — Plan of Care (Signed)

## 2022-01-01 ENCOUNTER — Other Ambulatory Visit (HOSPITAL_COMMUNITY): Payer: Self-pay

## 2022-01-01 DIAGNOSIS — R072 Precordial pain: Secondary | ICD-10-CM | POA: Diagnosis not present

## 2022-01-01 LAB — CBC WITH DIFFERENTIAL/PLATELET
Abs Immature Granulocytes: 0.01 10*3/uL (ref 0.00–0.07)
Basophils Absolute: 0 10*3/uL (ref 0.0–0.1)
Basophils Relative: 1 %
Eosinophils Absolute: 0.3 10*3/uL (ref 0.0–0.5)
Eosinophils Relative: 5 %
HCT: 34.4 % — ABNORMAL LOW (ref 39.0–52.0)
Hemoglobin: 11.7 g/dL — ABNORMAL LOW (ref 13.0–17.0)
Immature Granulocytes: 0 %
Lymphocytes Relative: 33 %
Lymphs Abs: 2.1 10*3/uL (ref 0.7–4.0)
MCH: 27.8 pg (ref 26.0–34.0)
MCHC: 34 g/dL (ref 30.0–36.0)
MCV: 81.7 fL (ref 80.0–100.0)
Monocytes Absolute: 0.6 10*3/uL (ref 0.1–1.0)
Monocytes Relative: 9 %
Neutro Abs: 3.3 10*3/uL (ref 1.7–7.7)
Neutrophils Relative %: 52 %
Platelets: 235 10*3/uL (ref 150–400)
RBC: 4.21 MIL/uL — ABNORMAL LOW (ref 4.22–5.81)
RDW: 14.3 % (ref 11.5–15.5)
WBC: 6.3 10*3/uL (ref 4.0–10.5)
nRBC: 0 % (ref 0.0–0.2)

## 2022-01-01 LAB — COMPREHENSIVE METABOLIC PANEL
ALT: 15 U/L (ref 0–44)
AST: 16 U/L (ref 15–41)
Albumin: 3.3 g/dL — ABNORMAL LOW (ref 3.5–5.0)
Alkaline Phosphatase: 54 U/L (ref 38–126)
Anion gap: 5 (ref 5–15)
BUN: 25 mg/dL — ABNORMAL HIGH (ref 8–23)
CO2: 23 mmol/L (ref 22–32)
Calcium: 8.6 mg/dL — ABNORMAL LOW (ref 8.9–10.3)
Chloride: 112 mmol/L — ABNORMAL HIGH (ref 98–111)
Creatinine, Ser: 1.92 mg/dL — ABNORMAL HIGH (ref 0.61–1.24)
GFR, Estimated: 34 mL/min — ABNORMAL LOW (ref >60)
Glucose, Bld: 93 mg/dL (ref 70–99)
Potassium: 4.7 mmol/L (ref 3.5–5.1)
Sodium: 140 mmol/L (ref 135–145)
Total Bilirubin: 1 mg/dL (ref 0.3–1.2)
Total Protein: 6.1 g/dL — ABNORMAL LOW (ref 6.5–8.1)

## 2022-01-01 LAB — UREA NITROGEN, URINE: Urea Nitrogen, Ur: 674 mg/dL

## 2022-01-01 LAB — MAGNESIUM: Magnesium: 1.8 mg/dL (ref 1.7–2.4)

## 2022-01-01 LAB — BRAIN NATRIURETIC PEPTIDE: B Natriuretic Peptide: 138.4 pg/mL — ABNORMAL HIGH (ref 0.0–100.0)

## 2022-01-01 MED ORDER — ROSUVASTATIN CALCIUM 10 MG PO TABS
10.0000 mg | ORAL_TABLET | Freq: Every day | ORAL | 0 refills | Status: DC
  Filled 2022-01-01: qty 30, 30d supply, fill #0

## 2022-01-01 MED ORDER — ASPIRIN 81 MG PO CHEW
81.0000 mg | CHEWABLE_TABLET | Freq: Every day | ORAL | 0 refills | Status: AC
  Filled 2022-01-01: qty 30, 30d supply, fill #0

## 2022-01-01 MED ORDER — ISOSORB DINITRATE-HYDRALAZINE 20-37.5 MG PO TABS
1.0000 | ORAL_TABLET | Freq: Three times a day (TID) | ORAL | 0 refills | Status: DC
  Filled 2022-01-01: qty 90, 30d supply, fill #0

## 2022-01-01 MED ORDER — ROSUVASTATIN CALCIUM 5 MG PO TABS
10.0000 mg | ORAL_TABLET | Freq: Every day | ORAL | Status: DC
  Administered 2022-01-01: 10 mg via ORAL
  Filled 2022-01-01: qty 2

## 2022-01-01 NOTE — Progress Notes (Signed)
Pt belongings with pt. Discharge education given with teach back. TOC medications given and with pt. Pt stated ready to go home, refused vaccine at this time.  RN called daughter to make aware of discharge.

## 2022-01-01 NOTE — TOC Transition Note (Signed)
Transition of Care [facility]) - CM/SW Discharge Note   Patient Details  Name: Gary Pierce MRN: [id number] Date of Birth: Sep 04, 1940  [facility] of [facility]) CM/SW Contact:  Zenon Mayo, RN Phone Number: 01/01/2022, 10:04 AM   Clinical Narrative:    Patient is for dc today, he has no needs.          Patient Goals and CMS Choice        Discharge Placement                       Discharge Plan and Services                                     Social Determinants of Health (SDOH) Interventions     Readmission Risk Interventions     View : No data to display.

## 2022-01-01 NOTE — Discharge Instructions (Signed)
Follow with Primary MD Milford Cage, PA in 7 days   Get CBC, CMP, 2 view Chest X ray -  checked next visit within 1 week by Primary MD    Activity: As tolerated with Full fall precautions use [place]/cane & assistance as needed  Disposition Home    Diet: Heart Healthy   Special Instructions: If you have smoked or chewed Tobacco  in the last 2 yrs please stop smoking, stop any regular Alcohol  and or any Recreational drug use.  On your next visit with your primary care physician please Get Medicines reviewed and adjusted.  Please request your Prim.MD to go over all Hospital Tests and Procedure/Radiological results at the follow up, please get all Hospital records sent to your Prim MD by signing hospital release before you go home.  If you experience worsening of your admission symptoms, develop shortness of breath, life threatening emergency, suicidal or homicidal thoughts you must seek medical attention immediately by calling 911 or calling your MD immediately  if symptoms less severe.  You Must read complete instructions/literature along with all the possible adverse reactions/side effects for all the Medicines you take and that have been prescribed to you. Take any new Medicines after you have completely understood and accpet all the possible adverse reactions/side effects.

## 2022-01-01 NOTE — Discharge Summary (Signed)
Gary Pierce YOK:[id number] DOB: Jun 10, 1941 DOA: 12/30/2021  PCP: Milford Cage, PA  Admit date: 12/30/2021  Discharge date: 01/01/2022  Admitted From: Home   Disposition:  Home   Recommendations for Outpatient Follow-up:   Follow up with PCP in 1-2 weeks  PCP Please obtain BMP/CBC, 2 view CXR in 1week,  (see Discharge instructions)   PCP Please follow up on the following pending results:    Home Health: None   Equipment/Devices: None  Consultations: Cards Discharge Condition: Stable    CODE STATUS: Full    Diet Recommendation: Heart Healthy     Chief Complaint  Patient presents with   Chest Pain     Brief history of present illness from the day of admission and additional interim summary     81 y.o. male with medical history significant of gout, hypertension, BPH, hyperlipidemia, asthma presenting with ongoing chest pain worse today.                                                                  Hospital Course   Nonspecific chest pain.  With some exertional component.  Thankfully EKG is nonacute and troponin stayed negative, currently chest pain-free, placed on baby aspirin, and BiDil by cardiology, no beta-blocker due to baseline low heart rate, now chest pain-free, cardiogram stable with preserved EF and no wall motion abnormality.  Will be discharged home with outpatient PCP and cardiology follow-up   AKI on CKD 4.  Baseline creatinine 2 years ago around 2.  Upon admission creatinine of 3, with hydration creatinine is improved, renal ultrasound not acute, PCP to monitor renal function will benefit from outpatient nephrology and urology follow-up due to underlying history of BPH as well.   Hypertension.  On Norvasc blood pressure was not well controlled hence BiDil was added by cardiology.    Asthma.  Stable no acute issues.  BPH.  Outpatient follow-up with urology to be directed by PCP.  Continue home medications  Discharge diagnosis     Principal Problem:   Chest pain Active Problems:   Essential hypertension   BPH (benign prostatic hyperplasia)   Hyperlipidemia   Asthma, chronic   CKD (chronic kidney disease) stage 4, GFR 15-29 ml/min (HCC)    Discharge instructions    Discharge Instructions     Diet - low sodium heart healthy   Complete by: As directed    Discharge instructions   Complete by: As directed    Follow with Primary MD Milford Cage, PA in 7 days   Get CBC, CMP, 2 view Chest X ray -  checked next visit within 1 week by Primary MD    Activity: As tolerated with Full fall precautions use [place]/cane & assistance as needed  Disposition Home    Diet: Heart Healthy  Special Instructions: If you have smoked or chewed Tobacco  in the last 2 yrs please stop smoking, stop any regular Alcohol  and or any Recreational drug use.  On your next visit with your primary care physician please Get Medicines reviewed and adjusted.  Please request your Prim.MD to go over all Hospital Tests and Procedure/Radiological results at the follow up, please get all Hospital records sent to your Prim MD by signing hospital release before you go home.  If you experience worsening of your admission symptoms, develop shortness of breath, life threatening emergency, suicidal or homicidal thoughts you must seek medical attention immediately by calling 911 or calling your MD immediately  if symptoms less severe.  You Must read complete instructions/literature along with all the possible adverse reactions/side effects for all the Medicines you take and that have been prescribed to you. Take any new Medicines after you have completely understood and accpet all the possible adverse reactions/side effects.   Increase activity slowly   Complete by: As directed         Discharge Medications   Allergies as of 01/01/2022       Reactions   Cephalexin Diarrhea        Medication List     STOP taking these medications    meloxicam 15 MG tablet Commonly known as: MOBIC       TAKE these medications    albuterol 108 (90 Base) MCG/ACT inhaler Commonly known as: VENTOLIN HFA Inhale 1-2 puffs into the lungs every 6 (six) hours as needed for wheezing or shortness of breath.   alfuzosin 10 MG 24 hr tablet Commonly known as: UROXATRAL Take 10 mg by mouth daily.   amLODipine 10 MG tablet Commonly known as: NORVASC Take 10 mg by mouth daily.   Aspirin Low Dose 81 MG chewable tablet Generic drug: aspirin Chew 1 tablet (81 mg total) by mouth daily.   benzonatate 100 MG capsule Commonly known as: TESSALON Take 1-2 capsules (100-200 mg total) by mouth 3 (three) times daily as needed. What changed: reasons to take this   cetirizine 5 MG tablet Commonly known as: ZYRTEC Take 1 tablet (5 mg total) by mouth daily.   clotrimazole-betamethasone cream Commonly known as: LOTRISONE Apply 1 application. topically 2 (two) times daily as needed (fungal infections).   colchicine 0.6 MG tablet Take 0.6 mg by mouth 4 (four) times daily as needed (gout).   isosorbide-hydrALAZINE 20-37.5 MG tablet Commonly known as: BIDIL Take 1 tablet by mouth 3 (three) times daily.   loperamide 2 MG capsule Commonly known as: IMODIUM Take 1 capsule (2 mg total) by mouth daily as needed for diarrhea or loose stools.   metroNIDAZOLE 1 % gel Commonly known as: METROGEL Apply 1 application. topically daily.   olmesartan 40 MG tablet Commonly known as: BENICAR Take 40 mg by mouth daily.   ondansetron 8 MG disintegrating tablet Commonly known as: ZOFRAN-ODT Take 1 tablet (8 mg total) by mouth every 8 (eight) hours as needed for nausea or vomiting.   Pfizer-BioNT COVID-19 Vac-TriS Susp injection Generic drug: COVID-19 mRNA Vac-TriS (Pfizer) Inject into the  muscle.   rosuvastatin 10 MG tablet Commonly known as: CRESTOR Take 1 tablet (10 mg total) by mouth daily.   silodosin 8 MG Caps capsule Commonly known as: RAPAFLO Take 8 mg by mouth daily.   zolpidem 10 MG tablet Commonly known as: AMBIEN Take 10 mg by mouth at bedtime as needed for sleep.  Follow-up Information     Milford Cage, PA. Schedule an appointment as soon as possible for a visit in 1 week(s).   Specialty: Physician Assistant Why: Also with a nephrologist of choice within 1 to 2 weeks.  Your PCP to make a referral. Contact information: [place] [address] [phone]         Nigel Mormon, MD. Schedule an appointment as soon as possible for a visit in 1 week(s).   Specialties: Cardiology, Radiology Contact information: [place] [address] [phone]         Milford Cage, PA. Go on 01/15/2022.   Specialty: Physician Assistant Why: $RemoveBefore'@3'JmKlaCokuzMMW$ :00pm Contact information: [place] [address] [phone]                 Major procedures and Radiology Reports - PLEASE review detailed and final reports thoroughly  -       DG Chest 2 View  Result Date: 12/30/2021 CLINICAL DATA:  Chest pain EXAM: CHEST - 2 VIEW COMPARISON:  11/03/2020 FINDINGS: Lungs are well expanded, symmetric, and clear. No pneumothorax or pleural effusion. Cardiac size within normal limits. Pulmonary vascularity is normal. Osseous structures are age-appropriate. No acute bone abnormality. IMPRESSION: No active cardiopulmonary disease. Electronically Signed   By: Fidela [place] M.D.   On: 12/30/2021 19:25   US RENAL  Result Date: 12/31/2021 CLINICAL DATA:  Acute renal insufficiency EXAM: RENAL / URINARY TRACT ULTRASOUND COMPLETE COMPARISON:  Dec 20, 2017 renal ultrasound FINDINGS: Right Kidney: Renal measurements: 9.6 x 4.8 x 5 4 cm = volume: 120 mL. Contains a 1.4 cm simple cyst of  no clinical significance. No follow-up imaging recommended the cyst. Left Kidney: Renal measurements: 9.4 x 5.3 x 5.6 cm = volume: 140 mL. Contains a small 12 mm cyst at the upper pole no clinical significance. No follow-up imaging recommended for the cyst. Bladder: Appears normal for degree of bladder distention. Other: The prostate is enlarged with a volume of 60.7 cc. IMPRESSION: 1. The kidneys and bladder are unremarkable. 2. Prostate enlargement as above.  The prostate volume is 60.7 cc. Electronically Signed   By: Dorise Bullion III M.D.   On: 12/31/2021 13:10   DG Chest Port 1 View  Result Date: 12/31/2021 CLINICAL DATA:  81 year old male with chest pain and shortness of breath. EXAM: PORTABLE CHEST 1 VIEW COMPARISON:  Chest radiographs 12/30/2021 and earlier. FINDINGS: Portable AP semi upright view at 0730 hours. Lower lung volumes. Mediastinal contours remain normal. Mild crowding of lung markings at the lung bases more so the right. No pneumothorax, pulmonary edema, pleural effusion, or consolidation. Visualized tracheal air column is within normal limits. No acute osseous abnormality identified. Paucity of bowel gas now in the upper abdomen. IMPRESSION: Lower lung volumes with atelectasis. No other acute cardiopulmonary abnormality. Electronically Signed   By: Genevie Ann M.D.   On: 12/31/2021 07:59   ECHOCARDIOGRAM COMPLETE  Result Date: 12/31/2021    ECHOCARDIOGRAM REPORT   Patient Name:   Gary Pierce Date of Exam: 12/31/2021 Medical Rec #:  [id number]   Height:       70.0 in Accession #:    [id number]  Weight:       200.8 lb Date of Birth:  28-Nov-1940   BSA:          2.091 m Patient Age:    81 years    BP:  155/87 mmHg Patient Gender: M           HR:           58 bpm. Exam Location:  Inpatient Procedure: 2D Echo, Cardiac Doppler and Color Doppler Indications:     Chest pain  History:         Patient has no prior history of Echocardiogram examinations.                  Risk  Factors:Hypertension.  Sonographer:     Jyl Heinz Referring Phys:  [id number] Candace Gallus MELVIN Diagnosing Phys: Vernell Leep MD  Sonographer Comments: Technically difficult study due to poor echo windows. IMPRESSIONS  1. Left ventricular ejection fraction, by estimation, is 60 to 65%. The left ventricle has normal function. The left ventricle has no regional wall motion abnormalities. Left ventricular diastolic parameters were normal.  2. Right ventricular systolic function is low normal. The right ventricular size is normal.  3. The mitral valve is normal in structure. No evidence of mitral valve regurgitation. No evidence of mitral stenosis.  4. The aortic valve is tricuspid. There is mild calcification of the aortic valve. Aortic valve regurgitation is not visualized. Mild aortic valve stenosis. Aortic valve Vmax measures 1.76 m/s. FINDINGS  Left Ventricle: Left ventricular ejection fraction, by estimation, is 60 to 65%. The left ventricle has normal function. The left ventricle has no regional wall motion abnormalities. The left ventricular internal cavity size was normal in size. There is  no left ventricular hypertrophy. Left ventricular diastolic parameters were normal. Right Ventricle: The right ventricular size is normal. No increase in right ventricular wall thickness. Right ventricular systolic function is low normal. Left Atrium: Left atrial size was normal in size. Right Atrium: Right atrial size was normal in size. Pericardium: There is no evidence of pericardial effusion. Mitral Valve: The mitral valve is normal in structure. No evidence of mitral valve regurgitation. No evidence of mitral valve stenosis. Tricuspid Valve: The tricuspid valve is grossly normal. Tricuspid valve regurgitation is mild. Aortic Valve: The aortic valve is tricuspid. There is mild calcification of the aortic valve. Aortic valve regurgitation is not visualized. Mild aortic stenosis is present. Aortic valve mean gradient  measures 12.0 mmHg. Aortic valve peak gradient measures 12.4 mmHg. Pulmonic Valve: The pulmonic valve was normal in structure. Pulmonic valve regurgitation is not visualized. No evidence of pulmonic stenosis. Aorta: The aortic root is normal in size and structure. IAS/Shunts: The interatrial septum is aneurysmal. The interatrial septum was not assessed.  LEFT VENTRICLE PLAX 2D LVIDd:         3.50 cm      Diastology LVIDs:         2.50 cm      LV e' medial:    7.46 cm/s LV PW:         0.90 cm      LV E/e' medial:  11.2 LV IVS:        0.90 cm      LV e' lateral:   9.95 cm/s LVOT diam:     2.00 cm      LV E/e' lateral: 8.4 LV SV:         99 LV SV Index:   47 LVOT Area:     3.14 cm  LV Volumes (MOD) LV vol d, MOD A2C: 160.0 ml LV vol d, MOD A4C: 144.0 ml LV vol s, MOD A2C: 59.5 ml LV vol s, MOD A4C: 52.7 ml LV SV  MOD A2C:     100.5 ml LV SV MOD A4C:     144.0 ml LV SV MOD BP:      98.5 ml RIGHT VENTRICLE RV Basal diam:  3.30 cm RV S prime:     8.95 cm/s TAPSE (M-mode): 2.0 cm LEFT ATRIUM             Index        RIGHT ATRIUM           Index LA diam:        3.60 cm 1.72 cm/m   RA Area:     12.90 cm LA Vol (A2C):   45.9 ml 21.95 ml/m  RA Volume:   28.10 ml  13.44 ml/m LA Vol (A4C):   47.5 ml 22.72 ml/m LA Biplane Vol: 47.0 ml 22.48 ml/m  AORTIC VALVE AV Area (Vmax): 2.05 cm AV Vmax:        176.00 cm/s AV Peak Grad:   12.4 mmHg AV Mean Grad:   12.0 mmHg LVOT Vmax:      115.00 cm/s LVOT Vmean:     85.200 cm/s LVOT VTI:       0.314 m  AORTA Ao Root diam: 3.20 cm Ao Asc diam:  3.20 cm MITRAL VALVE                TRICUSPID VALVE MV Area (PHT): 3.48 cm     TR Peak grad:   24.0 mmHg MV Decel Time: 218 msec     TR Vmax:        245.00 cm/s MV E velocity: 83.30 cm/s MV A velocity: 106.00 cm/s  SHUNTS MV E/A ratio:  0.79         Systemic VTI:  0.31 m                             Systemic Diam: 2.00 cm Vernell Leep MD Electronically signed by Vernell Leep MD Signature Date/Time: 12/31/2021/12:27:53 PM    Final      Micro Results    No results found for this or any previous visit (from the past 240 hour(s)).  Today   Subjective    Chrsitopher Wik today has no headache,no chest abdominal pain,no new weakness tingling or numbness, feels much better wants to go home today.     Objective   Blood pressure (!) 142/61, pulse (!) 55, temperature 98.2 F (36.8 C), temperature source Oral, resp. rate 18, height $RemoveBe'5\' 10"'kshhTwVyu$  (1.778 m), weight 91.5 kg, SpO2 98 %.   Intake/Output Summary (Last 24 hours) at 01/01/2022 0958 Last data filed at 01/01/2022 0948 Gross per 24 hour  Intake 1182.07 ml  Output 1400 ml  Net -217.93 ml    Exam  Awake Alert, No new F.N deficits,    [place].AT,PERRAL Supple Neck,   Symmetrical Chest wall movement, Good air movement bilaterally, CTAB RRR,No Gallops,   +ve B.Sounds, Abd Soft, Non tender,  No Cyanosis, Clubbing or edema    Data Review   CBC w Diff:  Lab Results  Component Value Date   WBC 6.3 01/01/2022   HGB 11.7 (L) 01/01/2022   HCT 34.4 (L) 01/01/2022   PLT 235 01/01/2022   LYMPHOPCT 33 01/01/2022   MONOPCT 9 01/01/2022   EOSPCT 5 01/01/2022   BASOPCT 1 01/01/2022    CMP:  Lab Results  Component Value Date   NA 140 01/01/2022   K 4.7 01/01/2022   CL 112 (  H) 01/01/2022   CO2 23 01/01/2022   BUN 25 (H) 01/01/2022   CREATININE 1.92 (H) 01/01/2022   CREATININE 1.12 10/19/2014   PROT 6.1 (L) 01/01/2022   ALBUMIN 3.3 (L) 01/01/2022   BILITOT 1.0 01/01/2022   ALKPHOS 54 01/01/2022   AST 16 01/01/2022   ALT 15 01/01/2022    Total Time in preparing paper work, data evaluation and todays exam - 41 minutes  Lala Lund M.D on 01/01/2022 at 9:58 AM  [facility]

## 2022-01-22 ENCOUNTER — Ambulatory Visit: Payer: Medicare Other

## 2022-01-22 DIAGNOSIS — R0609 Other forms of dyspnea: Secondary | ICD-10-CM

## 2022-01-29 ENCOUNTER — Ambulatory Visit: Payer: Medicare Other | Admitting: Cardiology

## 2022-02-05 ENCOUNTER — Telehealth: Payer: Self-pay

## 2022-02-05 ENCOUNTER — Ambulatory Visit: Payer: Medicare Other | Admitting: Cardiology

## 2022-02-05 ENCOUNTER — Encounter: Payer: Self-pay | Admitting: Cardiology

## 2022-02-05 VITALS — BP 150/74 | HR 86 | Temp 98.1°F | Resp 16 | Ht 70.0 in | Wt 195.8 lb

## 2022-02-05 DIAGNOSIS — R0609 Other forms of dyspnea: Secondary | ICD-10-CM

## 2022-02-05 DIAGNOSIS — I10 Essential (primary) hypertension: Secondary | ICD-10-CM

## 2022-02-05 DIAGNOSIS — E782 Mixed hyperlipidemia: Secondary | ICD-10-CM

## 2022-02-05 MED ORDER — LABETALOL HCL 100 MG PO TABS: 100.0000 mg | ORAL_TABLET | Freq: Two times a day (BID) | ORAL | 3 refills | Status: DC

## 2022-02-06 ENCOUNTER — Encounter: Payer: Self-pay | Admitting: Cardiology

## 2022-02-08 ENCOUNTER — Ambulatory Visit (INDEPENDENT_AMBULATORY_CARE_PROVIDER_SITE_OTHER): Payer: Medicare Other | Admitting: Family Medicine

## 2022-02-08 VITALS — BP 128/80 | HR 62 | Temp 98.1°F | Ht 70.0 in | Wt 196.0 lb

## 2022-02-08 DIAGNOSIS — N184 Chronic kidney disease, stage 4 (severe): Secondary | ICD-10-CM

## 2022-02-08 NOTE — Progress Notes (Signed)
Subjective:    Patient ID: Gary Pierce, male    DOB: Feb 07, 1941, 81 y.o.   MRN: [id number]  HPI  Patient is a very pleasant 81 year old African-American gentleman here today to establish care.  He has been told that he has chronic kidney disease and he would like to see a nephrologist.  Reviewing his most recent lab work from May, he had a glomerular filtration rate of 29 and just above 30.  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  Patient is unaware of any medical problems other than high blood pressure and borderline diabetes.  His hemoglobin A1c was 5.9 in May indicating prediabetes.  His blood pressure at times has been high however his blood pressure today is outstanding at 128/80.  In the past he took meloxicam for hip pain.  He has stopped the meloxicam.  He is not taking any NSAID.  He had a renal ultrasound that showed no evidence of hydronephrosis although it did show a mild prostatic enlargement.  He has not had a SPEP or urine protein to creatinine ratio is at the to determine if he has nephrotic range proteinuria.  He is on maximum dose angiotensin receptor blocker.  He is also on BiDil.  He has no history of congestive heart failure.  They are using this primarily for blood pressure control. Past Medical History:  Diagnosis Date   Hypercholesterolemia    Hypertension    Past Surgical History:  Procedure Laterality Date   COLONOSCOPY     2015  due in 2017   Current Outpatient Medications on File Prior to Visit  Medication Sig Dispense Refill   albuterol (VENTOLIN HFA) 108 (90 Base) MCG/ACT inhaler Inhale 1-2 puffs into the lungs every 6 (six) hours as needed for wheezing or shortness of breath. 18 g 0   alfuzosin (UROXATRAL) 10 MG 24 hr tablet Take 10 mg by mouth daily.     amLODipine (NORVASC) 10 MG tablet Take 10 mg by mouth daily.     aspirin 81 MG chewable tablet Chew 1 tablet (81 mg total) by mouth daily. 30 tablet 0   benzonatate (TESSALON)  100 MG capsule Take 1-2 capsules (100-200 mg total) by mouth 3 (three) times daily as needed. (Patient taking differently: Take 100-200 mg by mouth 3 (three) times daily as needed for cough.) 60 capsule 0   cetirizine (ZYRTEC) 5 MG tablet Take 1 tablet (5 mg total) by mouth daily. 30 tablet 0   colchicine 0.6 MG tablet Take 0.6 mg by mouth 4 (four) times daily as needed (gout).     COVID-19 mRNA Vac-TriS, Pfizer, (PFIZER-BIONT COVID-19 VAC-TRIS) SUSP injection Inject into the muscle. 0.3 mL 0   isosorbide-hydrALAZINE (BIDIL) 20-37.5 MG tablet Take 1 tablet by mouth 3 (three) times daily. 90 tablet 0   labetalol (NORMODYNE) 100 MG tablet Take 1 tablet (100 mg total) by mouth 2 (two) times daily. 60 tablet 3   loperamide (IMODIUM) 2 MG capsule Take 1 capsule (2 mg total) by mouth daily as needed for diarrhea or loose stools. 10 capsule 0   metroNIDAZOLE (METROGEL) 1 % gel Apply 1 application. topically daily.     olmesartan (BENICAR) 40 MG tablet Take 40 mg by mouth daily.     ondansetron (ZOFRAN-ODT) 8 MG disintegrating tablet Take 1 tablet (8 mg total) by mouth every 8 (eight) hours as needed for nausea or vomiting. 20 tablet 0   rosuvastatin (CRESTOR) 10 MG tablet Take 1 tablet (10  mg total) by mouth daily. 30 tablet 0   zolpidem (AMBIEN) 10 MG tablet Take 10 mg by mouth at bedtime as needed for sleep.     clotrimazole-betamethasone (LOTRISONE) cream Apply 1 application. topically 2 (two) times daily as needed (fungal infections). (Patient not taking: Reported on 02/08/2022)     Current Facility-Administered Medications on File Prior to Visit  Medication Dose Route Frequency Provider Last Rate Last Admin   betamethasone acetate-betamethasone sodium phosphate (CELESTONE) injection 3 mg  3 mg Intramuscular Once Edrick Kins, DPM       Allergies  Allergen Reactions   Cephalexin Diarrhea   Social History   Socioeconomic History   Marital status: Legally Separated    Spouse name: Not on file    Number of children: 3   Years of education: Not on file   Highest education level: Not on file  Occupational History   Not on file  Tobacco Use   Smoking status: Former    Years: 25.00    Types: Cigarettes    Quit date: 09/18/2012    Years since quitting: 9.3   Smokeless tobacco: Never  Vaping Use   Vaping Use: Never used  Substance and Sexual Activity   Alcohol use: No    Alcohol/week: 0.0 standard drinks of alcohol   Drug use: No   Sexual activity: Not on file  Other Topics Concern   Not on file  Social History Narrative   Not on file   Social Determinants of Health   Financial Resource Strain: Not on file  Food Insecurity: Not on file  Transportation Needs: Not on file  Physical Activity: Not on file  Stress: Not on file  Social Connections: Not on file  Intimate Partner Violence: Not on file   Family History  Problem Relation Age of Onset   Cancer Mother    Heart disease Father    Hyperlipidemia Father    Hypertension Father    Heart attack Father      Review of Systems  All other systems reviewed and are negative.      Objective:   Physical Exam Vitals reviewed.  Constitutional:      General: He is not in acute distress.    Appearance: Normal appearance. He is normal weight. He is not ill-appearing, toxic-appearing or diaphoretic.  Neck:     Vascular: No carotid bruit.  Cardiovascular:     Rate and Rhythm: Normal rate and regular rhythm.     Pulses: Normal pulses.     Heart sounds: Murmur heard.     No friction rub. No gallop.  Pulmonary:     Effort: Pulmonary effort is normal. No respiratory distress.     Breath sounds: Normal breath sounds. No stridor. No wheezing or rales.  Abdominal:     General: Abdomen is flat. Bowel sounds are normal. There is no distension.     Palpations: Abdomen is soft.     Tenderness: There is no abdominal tenderness. There is no guarding.  Musculoskeletal:     Cervical back: Rigidity present.     Right lower  leg: No edema.     Left lower leg: No edema.  Lymphadenopathy:     Cervical: No cervical adenopathy.  Skin:    Findings: No erythema.  Neurological:     Mental Status: He is alert.           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  I will consult nephrology.  His diabetes is well controlled.  He is off NSAIDs.  He is on maximal dose angiotensin receptor blocker.  I will check a urine protein to creatinine ratio.  If elevated greater than 300, we could consider adding farxiga vs kerendia.  I will check a PTH, phosphorus level, calcium level.  Recheck blood pressure in 1 to 2 weeks to give [place] an update.  Goal blood pressure is less than 130/80

## 2022-02-09 LAB — URINALYSIS, ROUTINE W REFLEX MICROSCOPIC
Bacteria, UA: NONE SEEN /HPF
Bilirubin Urine: NEGATIVE
Glucose, UA: NEGATIVE
Hgb urine dipstick: NEGATIVE
Ketones, ur: NEGATIVE
Leukocytes,Ua: NEGATIVE
Nitrite: NEGATIVE
RBC / HPF: NONE SEEN /HPF (ref 0–2)
Specific Gravity, Urine: 1.016 (ref 1.001–1.035)
Squamous Epithelial / HPF: NONE SEEN /HPF (ref <=5)
WBC, UA: NONE SEEN /HPF (ref 0–5)
pH: <=5 (ref 5.0–8.0)

## 2022-02-09 LAB — BASIC METABOLIC PANEL WITH GFR
BUN/Creatinine Ratio: 16 (calc) (ref 6–22)
BUN: 46 mg/dL — ABNORMAL HIGH (ref 7–25)
CO2: 23 mmol/L (ref 20–32)
Calcium: 9.6 mg/dL (ref 8.6–10.3)
Chloride: 109 mmol/L (ref 98–110)
Creat: 2.82 mg/dL — ABNORMAL HIGH (ref 0.70–1.22)
Glucose, Bld: 98 mg/dL (ref 65–99)
Potassium: 5.2 mmol/L (ref 3.5–5.3)
Sodium: 141 mmol/L (ref 135–146)
eGFR: 22 mL/min/{1.73_m2} — ABNORMAL LOW (ref >=60)

## 2022-02-09 LAB — PROTEIN / CREATININE RATIO, URINE
Creatinine, Urine: 199 mg/dL (ref 20–320)
Protein/Creat Ratio: 141 mg/g creat (ref 25–148)
Protein/Creatinine Ratio: 0.141 mg/mg creat (ref 0.025–0.148)
Total Protein, Urine: 28 mg/dL — ABNORMAL HIGH (ref 5–25)

## 2022-02-09 LAB — PHOSPHORUS: Phosphorus: 4.1 mg/dL (ref 2.1–4.3)

## 2022-02-09 LAB — EXTRA SPECIMEN

## 2022-02-09 LAB — PTH, INTACT (ICMA) AND IONIZED CALCIUM
Calcium, Ion: 5.1 mg/dL (ref 4.7–5.5)
Calcium: 9.6 mg/dL (ref 8.6–10.3)
PTH: 53 pg/mL (ref 16–77)

## 2022-02-09 LAB — MICROSCOPIC MESSAGE

## 2022-03-27 ENCOUNTER — Observation Stay (HOSPITAL_COMMUNITY)
Admission: EM | Admit: 2022-03-27 | Discharge: 2022-03-28 | Disposition: A | Discharge status: Home / Self Care | Payer: Medicare Other | Attending: Internal Medicine | Admitting: Internal Medicine

## 2022-03-27 ENCOUNTER — Other Ambulatory Visit: Payer: Self-pay

## 2022-03-27 ENCOUNTER — Emergency Department (HOSPITAL_COMMUNITY): Payer: Medicare Other

## 2022-03-27 ENCOUNTER — Encounter (HOSPITAL_COMMUNITY): Payer: Self-pay | Admitting: Internal Medicine

## 2022-03-27 DIAGNOSIS — I129 Hypertensive chronic kidney disease with stage 1 through stage 4 chronic kidney disease, or unspecified chronic kidney disease: Secondary | ICD-10-CM | POA: Diagnosis not present

## 2022-03-27 DIAGNOSIS — I10 Essential (primary) hypertension: Secondary | ICD-10-CM | POA: Diagnosis not present

## 2022-03-27 DIAGNOSIS — Z87891 Personal history of nicotine dependence: Secondary | ICD-10-CM | POA: Insufficient documentation

## 2022-03-27 DIAGNOSIS — R0609 Other forms of dyspnea: Principal | ICD-10-CM | POA: Insufficient documentation

## 2022-03-27 DIAGNOSIS — Z7982 Long term (current) use of aspirin: Secondary | ICD-10-CM | POA: Insufficient documentation

## 2022-03-27 DIAGNOSIS — N184 Chronic kidney disease, stage 4 (severe): Secondary | ICD-10-CM | POA: Insufficient documentation

## 2022-03-27 DIAGNOSIS — Z79899 Other long term (current) drug therapy: Secondary | ICD-10-CM | POA: Diagnosis not present

## 2022-03-27 DIAGNOSIS — D649 Anemia, unspecified: Secondary | ICD-10-CM | POA: Insufficient documentation

## 2022-03-27 DIAGNOSIS — R2242 Localized swelling, mass and lump, left lower limb: Secondary | ICD-10-CM | POA: Diagnosis not present

## 2022-03-27 LAB — COMPREHENSIVE METABOLIC PANEL
ALT: 18 U/L (ref 0–44)
AST: 20 U/L (ref 15–41)
Albumin: 3.8 g/dL (ref 3.5–5.0)
Alkaline Phosphatase: 64 U/L (ref 38–126)
Anion gap: 6 (ref 5–15)
BUN: 38 mg/dL — ABNORMAL HIGH (ref 8–23)
CO2: 23 mmol/L (ref 22–32)
Calcium: 9.6 mg/dL (ref 8.9–10.3)
Chloride: 109 mmol/L (ref 98–111)
Creatinine, Ser: 2.58 mg/dL — ABNORMAL HIGH (ref 0.61–1.24)
GFR, Estimated: 24 mL/min — ABNORMAL LOW (ref >60)
Glucose, Bld: 102 mg/dL — ABNORMAL HIGH (ref 70–99)
Potassium: 5.3 mmol/L — ABNORMAL HIGH (ref 3.5–5.1)
Sodium: 138 mmol/L (ref 135–145)
Total Bilirubin: 0.5 mg/dL (ref 0.3–1.2)
Total Protein: 6.8 g/dL (ref 6.5–8.1)

## 2022-03-27 LAB — CBC
HCT: 35.2 % — ABNORMAL LOW (ref 39.0–52.0)
Hemoglobin: 12.3 g/dL — ABNORMAL LOW (ref 13.0–17.0)
MCH: 28 pg (ref 26.0–34.0)
MCHC: 34.9 g/dL (ref 30.0–36.0)
MCV: 80 fL (ref 80.0–100.0)
Platelets: 239 10*3/uL (ref 150–400)
RBC: 4.4 MIL/uL (ref 4.22–5.81)
RDW: 14.5 % (ref 11.5–15.5)
WBC: 5.7 10*3/uL (ref 4.0–10.5)
nRBC: 0 % (ref 0.0–0.2)

## 2022-03-27 LAB — TROPONIN I (HIGH SENSITIVITY)
Troponin I (High Sensitivity): 6 ng/L (ref <18)
Troponin I (High Sensitivity): 7 ng/L (ref <18)

## 2022-03-27 LAB — BRAIN NATRIURETIC PEPTIDE: B Natriuretic Peptide: 27.1 pg/mL (ref 0.0–100.0)

## 2022-03-27 LAB — D-DIMER, QUANTITATIVE: D-Dimer, Quant: 1.04 ug/mL-FEU — ABNORMAL HIGH (ref 0.00–0.50)

## 2022-03-27 MED ORDER — SODIUM ZIRCONIUM CYCLOSILICATE 10 G PO PACK
10.0000 g | PACK | Freq: Once | ORAL | Status: AC
  Administered 2022-03-27: 10 g via ORAL
  Filled 2022-03-27: qty 1

## 2022-03-27 MED ORDER — ROSUVASTATIN CALCIUM 5 MG PO TABS
10.0000 mg | ORAL_TABLET | Freq: Every day | ORAL | Status: DC
  Administered 2022-03-28: 10 mg via ORAL
  Filled 2022-03-27: qty 2

## 2022-03-27 MED ORDER — ALFUZOSIN HCL ER 10 MG PO TB24
10.0000 mg | ORAL_TABLET | Freq: Every day | ORAL | Status: DC
  Administered 2022-03-28: 10 mg via ORAL
  Filled 2022-03-27: qty 1

## 2022-03-27 MED ORDER — ACETAMINOPHEN 650 MG RE SUPP: 650.0000 mg | Freq: Four times a day (QID) | RECTAL | Status: DC | PRN

## 2022-03-27 MED ORDER — LABETALOL HCL 200 MG PO TABS
100.0000 mg | ORAL_TABLET | Freq: Every day | ORAL | Status: DC
  Administered 2022-03-28: 100 mg via ORAL
  Filled 2022-03-27: qty 1

## 2022-03-27 MED ORDER — LIDOCAINE-EPINEPHRINE (PF) 2 %-1:200000 IJ SOLN
10.0000 mL | Freq: Once | INTRAMUSCULAR | Status: DC
  Filled 2022-03-27: qty 20

## 2022-03-27 MED ORDER — HYDRALAZINE HCL 20 MG/ML IJ SOLN: 5.0000 mg | INTRAMUSCULAR | Status: DC | PRN

## 2022-03-27 MED ORDER — IRBESARTAN 300 MG PO TABS
300.0000 mg | ORAL_TABLET | Freq: Every day | ORAL | Status: DC
  Filled 2022-03-27: qty 1

## 2022-03-27 MED ORDER — HEPARIN (PORCINE) 25000 UT/250ML-% IV SOLN
1500.0000 [IU]/h | INTRAVENOUS | Status: DC
  Administered 2022-03-27: 1500 [IU]/h via INTRAVENOUS
  Filled 2022-03-27: qty 250

## 2022-03-27 MED ORDER — ACETAMINOPHEN 325 MG PO TABS: 650.0000 mg | ORAL_TABLET | Freq: Four times a day (QID) | ORAL | Status: DC | PRN

## 2022-03-27 MED ORDER — TAMSULOSIN HCL 0.4 MG PO CAPS
0.4000 mg | ORAL_CAPSULE | Freq: Every day | ORAL | Status: DC
  Administered 2022-03-28: 0.4 mg via ORAL
  Filled 2022-03-27: qty 1

## 2022-03-27 MED ORDER — ASPIRIN 81 MG PO CHEW
81.0000 mg | CHEWABLE_TABLET | Freq: Every day | ORAL | Status: DC
  Administered 2022-03-28: 81 mg via ORAL
  Filled 2022-03-27: qty 1

## 2022-03-27 MED ORDER — HEPARIN BOLUS VIA INFUSION
5400.0000 [IU] | Freq: Once | INTRAVENOUS | Status: AC
  Administered 2022-03-27: 5400 [IU] via INTRAVENOUS
  Filled 2022-03-27: qty 5400

## 2022-03-27 MED ORDER — ALBUTEROL SULFATE (2.5 MG/3ML) 0.083% IN NEBU: 2.5000 mg | INHALATION_SOLUTION | RESPIRATORY_TRACT | Status: DC | PRN

## 2022-03-27 MED ORDER — LIDOCAINE 5 % EX PTCH
1.0000 | MEDICATED_PATCH | CUTANEOUS | Status: DC
  Administered 2022-03-27: 1 via TRANSDERMAL
  Filled 2022-03-27: qty 1

## 2022-03-27 NOTE — ED Provider Triage Note (Signed)
Emergency Medicine Provider Triage Evaluation Note  Gary Pierce , a 81 y.o. male  was evaluated in triage.  Pt complains of shortness of breath, dizziness, and chest pain. He states that same has been ongoing and unchanged since June. He states that his pain is located in the middle of his chest and does not radiate. His dizziness and shortness of breath is exertional. Was seen for same in June and had normal echo and stress testing. States that yesterday he saw a commercial about afib and wondered if he has the same. Denies fevers, chills, nausea, vomiting  Review of Systems  Positive:  Negative:   Physical Exam  BP 105/66 (BP Location: Right Arm)   Pulse 68   Temp 98.3 F (36.8 C) (Oral)   Resp 16   SpO2 98%  Gen:   Awake, no distress   Resp:  Normal effort  MSK:   Moves extremities without difficulty  Other:    Medical Decision Making  Medically screening exam initiated at 10:02 AM.  Appropriate orders placed.  Miriam Kestler was informed that the remainder of the evaluation will be completed by another provider, this initial triage assessment does not replace that evaluation, and the importance of remaining in the ED until their evaluation is complete.     Bud Face, PA-C 03/27/22 1005

## 2022-03-27 NOTE — Progress Notes (Signed)
ANTICOAGULATION CONSULT NOTE - Initial Consult  Pharmacy Consult for heparin Indication: pulmonary embolus  Allergies  Allergen Reactions   Cephalexin Diarrhea    Patient Measurements:   Heparin Dosing Weight: TBW  Vital Signs: Temp: 97.7 F (36.5 C) (08/15 1529) Temp Source: Oral (08/15 1225) BP: 138/82 (08/15 1830) Pulse Rate: 61 (08/15 1830)  Labs: Recent Labs    03/27/22 0909 03/27/22 1450  HGB 12.3*  --   HCT 35.2*  --   PLT 239  --   CREATININE 2.58*  --   TROPONINIHS 6 7    CrCl cannot be calculated (Unknown ideal weight.).   Medical History: Past Medical History:  Diagnosis Date   Hypercholesterolemia    Hypertension      Assessment: 29 YOM presenting with SOB and dizziness, concern for PE awaiting V/Q scan with renal dysfunction.  He is not on anticoagulation PTA  Goal of Therapy:  Heparin level 0.3-0.7 units/ml Monitor platelets by anticoagulation protocol: Yes   Plan:  Heparin 5400 units IV x 1, and gtt at 1500 units/hr F/u 8 hour heparin level F/u V/Q scan results and [facility] plan  Bertis Ruddy, PharmD Clinical Pharmacist ED Pharmacist Phone # [phone] 03/27/2022 7:52 PM

## 2022-03-27 NOTE — ED Triage Notes (Signed)
Pt presents with shob and dizziness.  Pt states he has had this since June   Was admitted previously for same he reports.  Also reports tightness in his chest.

## 2022-03-27 NOTE — ED Provider Notes (Signed)
[facility] EMERGENCY DEPARTMENT Provider Note   CSN: [id number] Arrival date & time: 03/27/22  0849     History  Chief Complaint  Patient presents with   Shortness of Breath    Gary Pierce is a 81 y.o. male.   Shortness of Breath Associated symptoms: chest pain      81 year old male with medical history significant for mild aortic stenosis, HTN, HLD, follows outpatient with Dr. Shanon Brow of cardiology who presents to the emergency department with roughly 1 month of worsening exertional dyspnea and lightheadedness.  The patient states that his symptoms have been ongoing since his last cardiology visit in June.  He was hospitalized for this back in May 2023.  During his hospitalization his work-up showed mild aortic stenosis, otherwise normal echocardiogram, no heart failure, stress testing without evidence of ischemia.  He has had persistent hypertensive blood pressures and continues to have exertional dyspnea.  He has CKD stage IV and has a follow-up appointment with [facility] scheduled within a month.  He has since stopped taking meloxicam given his CKD which he had previously been taking for chronic hip pain.  He states that his dyspnea has worsened recently to the point where he gets exertional dyspnea just when walking to the mailbox from his apartment complex.  He has difficulty going up stairs.  He denies any fevers or chills.  He endorses a nonproductive cough.  He states that he has had some lower extremity swelling in his left lower extremity.  He states that this is chronic.  Occasionally gets sharp pain in the center of his chest with no radiation.  Home Medications Prior to Admission medications   Medication Sig Start Date End Date Taking? Authorizing Provider  albuterol (VENTOLIN HFA) 108 (90 Base) MCG/ACT inhaler Inhale 1-2 puffs into the lungs every 6 (six) hours as needed for wheezing or shortness of breath. 11/03/20   Jaynee Eagles, PA-C  alfuzosin  (UROXATRAL) 10 MG 24 hr tablet Take 10 mg by mouth daily. 10/07/21   [provider]  amLODipine (NORVASC) 10 MG tablet Take 10 mg by mouth daily. 10/03/20   [provider]  aspirin 81 MG chewable tablet Chew 1 tablet (81 mg total) by mouth daily. 01/01/22   Thurnell Lose, MD  benzonatate (TESSALON) 100 MG capsule Take 1-2 capsules (100-200 mg total) by mouth 3 (three) times daily as needed. Patient taking differently: Take 100-200 mg by mouth 3 (three) times daily as needed for cough. 11/03/20   Jaynee Eagles, PA-C  cetirizine (ZYRTEC) 5 MG tablet Take 1 tablet (5 mg total) by mouth daily. 11/03/20   Jaynee Eagles, PA-C  clotrimazole-betamethasone (LOTRISONE) cream Apply 1 application. topically 2 (two) times daily as needed (fungal infections). Patient not taking: Reported on 02/08/2022 10/23/21   [provider]  colchicine 0.6 MG tablet Take 0.6 mg by mouth 4 (four) times daily as needed (gout). 10/23/21   [provider]  COVID-19 mRNA Vac-TriS, Pfizer, (PFIZER-BIONT COVID-19 VAC-TRIS) SUSP injection Inject into the muscle. 02/16/21   Carlyle Basques, MD  isosorbide-hydrALAZINE (BIDIL) 20-37.5 MG tablet Take 1 tablet by mouth 3 (three) times daily. 01/01/22   Thurnell Lose, MD  labetalol (NORMODYNE) 100 MG tablet Take 1 tablet (100 mg total) by mouth 2 (two) times daily. 02/05/22   Patwardhan, Reynold Bowen, MD  loperamide (IMODIUM) 2 MG capsule Take 1 capsule (2 mg total) by mouth daily as needed for diarrhea or loose stools. 08/16/20   Jaynee Eagles,  PA-C  metroNIDAZOLE (METROGEL) 1 % gel Apply 1 application. topically daily. 10/23/21   [provider]  olmesartan (BENICAR) 40 MG tablet Take 40 mg by mouth daily. 10/10/21   [provider]  ondansetron (ZOFRAN-ODT) 8 MG disintegrating tablet Take 1 tablet (8 mg total) by mouth every 8 (eight) hours as needed for nausea or vomiting. 08/16/20   Jaynee Eagles, PA-C  rosuvastatin (CRESTOR) 10 MG tablet Take 1 tablet  (10 mg total) by mouth daily. 01/01/22   Thurnell Lose, MD  zolpidem (AMBIEN) 10 MG tablet Take 10 mg by mouth at bedtime as needed for sleep. 12/30/21   [provider]      Allergies    Cephalexin    Review of Systems   Review of Systems  Respiratory:  Positive for shortness of breath.   Cardiovascular:  Positive for chest pain and leg swelling.  All other systems reviewed and are negative.   Physical Exam Updated Vital Signs BP 138/82   Pulse 61   Temp 97.7 F (36.5 C)   Resp 20   SpO2 98%  Physical Exam Vitals and nursing note reviewed.  Constitutional:      General: He is not in acute distress.    Appearance: He is well-developed.  HENT:     Head: Normocephalic and atraumatic.  Eyes:     Conjunctiva/sclera: Conjunctivae normal.  Neck:     Vascular: No JVD.  Cardiovascular:     Rate and Rhythm: Normal rate and regular rhythm.     Heart sounds: No murmur heard. Pulmonary:     Effort: Pulmonary effort is normal. No respiratory distress.     Breath sounds: Normal breath sounds. No decreased breath sounds, wheezing or rhonchi.  Abdominal:     Palpations: Abdomen is soft.     Tenderness: There is no abdominal tenderness.  Musculoskeletal:        General: No swelling.     Cervical back: Neck supple.     Right lower leg: No edema.     Left lower leg: Edema present.     Comments: Mild left lower extremity trace pitting edema  Skin:    General: Skin is warm and dry.     Capillary Refill: Capillary refill takes less than 2 seconds.  Neurological:     Mental Status: He is alert.  Psychiatric:        Mood and Affect: Mood normal.     ED Results / Procedures / Treatments   Labs (all labs ordered are listed, but only abnormal results are displayed) Labs Reviewed  CBC - Abnormal; Notable for the following components:      Result Value   Hemoglobin 12.3 (*)    HCT 35.2 (*)    All other components within normal limits  COMPREHENSIVE METABOLIC PANEL -  Abnormal; Notable for the following components:   Potassium 5.3 (*)    Glucose, Bld 102 (*)    BUN 38 (*)    Creatinine, Ser 2.58 (*)    GFR, Estimated 24 (*)    All other components within normal limits  D-DIMER, QUANTITATIVE - Abnormal; Notable for the following components:   D-Dimer, Quant 1.04 (*)    All other components within normal limits  BRAIN NATRIURETIC PEPTIDE  HEPARIN LEVEL (UNFRACTIONATED)  CBC  TROPONIN I (HIGH SENSITIVITY)  TROPONIN I (HIGH SENSITIVITY)    EKG EKG Interpretation  Date/Time:  Tuesday March 27 2022 09:07:31 EDT Ventricular Rate:  72 PR Interval:  174  QRS Duration: 90 QT Interval:  352 QTC Calculation: 385 R Axis:   -24 Text Interpretation: Normal sinus rhythm Minimal voltage criteria for LVH, may be normal variant ( R in aVL ) Possible Lateral infarct , age undetermined similar to May 2023 Confirmed by Sherwood Gambler [phone]) on 03/27/2022 3:20:44 PM  Radiology DG Chest 2 View  Result Date: 03/27/2022 CLINICAL DATA:  Chest pain.  Shortness of breath and dizziness. EXAM: CHEST - 2 VIEW COMPARISON:  12/31/2021 FINDINGS: Low lung volumes. The lungs are clear without focal pneumonia, edema, pneumothorax or pleural effusion. The cardiopericardial silhouette is within normal limits for size. The visualized bony structures of the thorax are unremarkable. IMPRESSION: Low volume film without acute cardiopulmonary findings. Electronically Signed   By: Misty Stanley M.D.   On: 03/27/2022 10:20    Procedures Ultrasound ED Soft Tissue  Date/Time: 03/27/2022 8:06 PM  Performed by: Regan Lemming, MD Authorized by: Regan Lemming, MD   Procedure details:    Indications: localization of abscess and evaluate for cellulitis     Transverse view:  Visualized   Longitudinal view:  Visualized   Images: not archived   Location:    Location: lower back     Side:  Midline Findings:     no abscess present    no cellulitis present     Medications Ordered in  ED Medications  albuterol (PROVENTIL) (2.5 MG/3ML) 0.083% nebulizer solution 2.5 mg (has no administration in time range)  lidocaine (LIDODERM) 5 % 1 patch (has no administration in time range)  lidocaine-EPINEPHrine (XYLOCAINE W/EPI) 2 %-1:200000 (PF) injection 10 mL (has no administration in time range)  heparin bolus via infusion 5,400 Units (has no administration in time range)  heparin ADULT infusion 100 units/mL (25000 units/235mL) (has no administration in time range)  sodium zirconium cyclosilicate (LOKELMA) packet 10 g (10 g Oral Given 03/27/22 1730)    ED Course/ Medical Decision Making/ A&P                           Medical Decision Making Amount and/or Complexity of Data Reviewed Labs: ordered. Radiology: ordered.  Risk Prescription drug management. Decision regarding hospitalization.    81 year old male with medical history significant for mild aortic stenosis, HTN, HLD, follows outpatient with Dr. Shanon Brow of cardiology who presents to the emergency department with roughly 1 month of worsening exertional dyspnea and lightheadedness.  The patient states that his symptoms have been ongoing since his last cardiology visit in June.  He was hospitalized for this back in May 2023.  During his hospitalization his work-up showed mild aortic stenosis, otherwise normal echocardiogram, no heart failure, stress testing without evidence of ischemia.  He has had persistent hypertensive blood pressures and continues to have exertional dyspnea.  He has CKD stage IV and has a follow-up appointment with [facility] scheduled within a month.  He has since stopped taking meloxicam given his CKD which he had previously been taking for chronic hip pain.  He states that his dyspnea has worsened recently to the point where he gets exertional dyspnea just when walking to the mailbox from his apartment complex.  He has difficulty going up stairs.  He denies any fevers or chills.  He endorses a  nonproductive cough.  He states that he has had some lower extremity swelling in his left lower extremity.  He states that this is chronic.  Occasionally gets sharp pain in the center of his chest with  no radiation.  On arrival, the patient was vitally stable, afebrile, not tachycardic, mildly tachypneic RR 22, saturating 96% on room air, BP 123/64.  Patient presenting with exertional dyspnea that is acute on chronic.  Differential diagnosis includes symptomatic aortic stenosis, less likely ACS, considered PE/DVT, sarcoidosis, amyloidosis, pneumonia, pneumothorax, pleural effusion, pericardial effusion, new onset CHF.  EKG: Sinus rhythm, ventricular rate 72, no acute ischemic changes, minimal voltage criteria for LVH was noted.  Chest x-ray: No acute cardiopulmonary abnormality, low lung volume film without acute findings.  CBC without a leukocytosis, mild anemia to 12.3, CMP with mild hyperkalemia to 5.3, administered Lokelma, creatinine stable at 2.58 given the patient's baseline CKD, BUN 38, no significant electrolyte abnormality beyond mild hyperkalemia, troponins x2 normal, BNP normal.  D-dimer was mildly elevated after adjustment for age.  Due to this, lower extremity DVT ultrasound was ordered.  Given the patient's CKD, VQ scan was ordered and the patient was started on heparin.  I did speak with on-call cardiology, Dr. Virgina Jock who not feel that the patient's exertional dyspnea was due to to his mild aortic stenosis.  He will follow the patient inpatient and evaluate him in the morning.  Given the concern for possible PE, the patient was started on heparin.  He additionally complained on repeat assessment of pain in the vicinity of a prior pilonidal cyst.  No evidence for cellulitis.  The area was palpated and he had mild bony tenderness.  No sacral ulceration noted.  A point-of-care ultrasound was performed and revealed no evidence of cellulitis or abscess.  No underlying pilonidal cyst noted on  ultrasound.  A lidocaine patch was provided for pain control.  No indication for incision and drainage at this time.  Dr. Hal Hope of hospitalist medicine was subsequently consulted for admission and accepted the patient in admission.  Final Clinical Impression(s) / ED Diagnoses Final diagnoses:  Exertional dyspnea  Localized swelling of left lower extremity    Rx / DC Orders ED Discharge Orders     None         Regan Lemming, MD 03/27/22 2006

## 2022-03-27 NOTE — H&P (Signed)
History and Physical    Gary Pierce ZOX:[id number] DOB: 07-13-1941 DOA: 03/27/2022  PCP: Susy Frizzle, MD  Patient coming from: Home.  Chief Complaint: Shortness of breath.  HPI: Gary Pierce is a 81 y.o. male with history of hypertension, chronic kidney disease stage IV, hyperlipidemia presents to the ER because of progressive worsening shortness of breath.  Patient states over the last 2 months he has been getting progressively short of breath on exertion and some chest tightness.  Denies any fever chills or productive cough.  Has had stress test in June 2023 which was a low risk study.  2D echo done in May 2023 showed EF of 60 to 65% with mild aortic stenosis.  ED Course: In the ER cardiac markers chest x-ray were unremarkable.  BNP and troponins were unremarkable.  ER physician discussed with patient's cardiologist Dr. Virgina Jock.  Cardiologist at this time feels that the symptoms are not likely from his mild aortic stenosis.  D-dimer was elevated so was started on heparin and admitted for getting VQ scan and Dopplers of the lower extremity.  Review of Systems: As per HPI, rest all negative.   Past Medical History:  Diagnosis Date   Hypercholesterolemia    Hypertension     Past Surgical History:  Procedure Laterality Date   COLONOSCOPY     2015  due in 2017     reports that he quit smoking about 9 years ago. His smoking use included cigarettes. He has never used smokeless tobacco. He reports that he does not drink alcohol and does not use drugs.  Allergies  Allergen Reactions   Cephalexin Diarrhea    Family History  Problem Relation Age of Onset   Cancer Mother    Heart disease Father    Hyperlipidemia Father    Hypertension Father    Heart attack Father     Prior to Admission medications   Medication Sig Start Date End Date Taking? Authorizing Provider  albuterol (VENTOLIN HFA) 108 (90 Base) MCG/ACT inhaler Inhale 1-2 puffs into the lungs every 6 (six) hours as  needed for wheezing or shortness of breath. 11/03/20   Jaynee Eagles, PA-C  alfuzosin (UROXATRAL) 10 MG 24 hr tablet Take 10 mg by mouth daily. 10/07/21   [provider]  amLODipine (NORVASC) 10 MG tablet Take 10 mg by mouth daily. 10/03/20   [provider]  aspirin 81 MG chewable tablet Chew 1 tablet (81 mg total) by mouth daily. 01/01/22   Thurnell Lose, MD  benzonatate (TESSALON) 100 MG capsule Take 1-2 capsules (100-200 mg total) by mouth 3 (three) times daily as needed. Patient taking differently: Take 100-200 mg by mouth 3 (three) times daily as needed for cough. 11/03/20   Jaynee Eagles, PA-C  cetirizine (ZYRTEC) 5 MG tablet Take 1 tablet (5 mg total) by mouth daily. 11/03/20   Jaynee Eagles, PA-C  clotrimazole-betamethasone (LOTRISONE) cream Apply 1 application. topically 2 (two) times daily as needed (fungal infections). Patient not taking: Reported on 02/08/2022 10/23/21   [provider]  colchicine 0.6 MG tablet Take 0.6 mg by mouth 4 (four) times daily as needed (gout). 10/23/21   [provider]  COVID-19 mRNA Vac-TriS, Pfizer, (PFIZER-BIONT COVID-19 VAC-TRIS) SUSP injection Inject into the muscle. 02/16/21   Carlyle Basques, MD  isosorbide-hydrALAZINE (BIDIL) 20-37.5 MG tablet Take 1 tablet by mouth 3 (three) times daily. 01/01/22   Thurnell Lose, MD  labetalol (NORMODYNE) 100 MG tablet Take 1 tablet (100 mg total) by mouth  2 (two) times daily. 02/05/22   Patwardhan, Anabel Bene, MD  loperamide (IMODIUM) 2 MG capsule Take 1 capsule (2 mg total) by mouth daily as needed for diarrhea or loose stools. 08/16/20   Wallis Bamberg, PA-C  metroNIDAZOLE (METROGEL) 1 % gel Apply 1 application. topically daily. 10/23/21   [provider]  olmesartan (BENICAR) 40 MG tablet Take 40 mg by mouth daily. 10/10/21   [provider]  ondansetron (ZOFRAN-ODT) 8 MG disintegrating tablet Take 1 tablet (8 mg total) by mouth every 8 (eight) hours as needed for nausea or  vomiting. 08/16/20   Wallis Bamberg, PA-C  rosuvastatin (CRESTOR) 10 MG tablet Take 1 tablet (10 mg total) by mouth daily. 01/01/22   Leroy Sea, MD  zolpidem (AMBIEN) 10 MG tablet Take 10 mg by mouth at bedtime as needed for sleep. 12/30/21   [provider]    Physical Exam: Constitutional: Moderately built and nourished. Vitals:   03/27/22 1801 03/27/22 1830 03/27/22 1900 03/27/22 2010  BP:  138/82 137/84   Pulse: 62 61 64   Resp: 18 20 (!) 22   Temp:    97.7 F (36.5 C)  TempSrc:    Oral  SpO2: 100% 98% 97%    Eyes: Anicteric no pallor. ENMT: No discharge from the ears eyes nose and mouth. Neck: No JVD elevated no mass felt. Respiratory: No rhonchi or crepitations. Cardiovascular: S1-S2 heard. Abdomen: Soft nontender bowel sound present. Musculoskeletal: Mild edema of the lower extremity. Skin: No rash. Neurologic: Alert awake oriented to time place and person.  Moves all extremities. Psychiatric: Appears normal.  Normal affect.   Labs on Admission: I have personally reviewed following labs and imaging studies  CBC: Recent Labs  Lab 03/27/22 0909  WBC 5.7  HGB 12.3*  HCT 35.2*  MCV 80.0  PLT 239   Basic Metabolic Panel: Recent Labs  Lab 03/27/22 0909  NA 138  K 5.3*  CL 109  CO2 23  GLUCOSE 102*  BUN 38*  CREATININE 2.58*  CALCIUM 9.6   GFR: CrCl cannot be calculated (Unknown ideal weight.). Liver Function Tests: Recent Labs  Lab 03/27/22 0909  AST 20  ALT 18  ALKPHOS 64  BILITOT 0.5  PROT 6.8  ALBUMIN 3.8   No results for input(s): "LIPASE", "AMYLASE" in the last 168 hours. No results for input(s): "AMMONIA" in the last 168 hours. Coagulation Profile: No results for input(s): "INR", "PROTIME" in the last 168 hours. Cardiac Enzymes: No results for input(s): "CKTOTAL", "CKMB", "CKMBINDEX", "TROPONINI" in the last 168 hours. BNP (last 3 results) No results for input(s): "PROBNP" in the last 8760 hours. HbA1C: No results for  input(s): "HGBA1C" in the last 72 hours. CBG: No results for input(s): "GLUCAP" in the last 168 hours. Lipid Profile: No results for input(s): "CHOL", "HDL", "LDLCALC", "TRIG", "CHOLHDL", "LDLDIRECT" in the last 72 hours. Thyroid Function Tests: No results for input(s): "TSH", "T4TOTAL", "FREET4", "T3FREE", "THYROIDAB" in the last 72 hours. Anemia Panel: No results for input(s): "VITAMINB12", "FOLATE", "FERRITIN", "TIBC", "IRON", "RETICCTPCT" in the last 72 hours. Urine analysis:    Component Value Date/Time   COLORURINE YELLOW 02/08/2022 0851   APPEARANCEUR CLEAR 02/08/2022 0851   LABSPEC 1.016 02/08/2022 0851   PHURINE < OR = 5.0 02/08/2022 0851   GLUCOSEU NEGATIVE 02/08/2022 0851   HGBUR NEGATIVE 02/08/2022 0851   KETONESUR NEGATIVE 02/08/2022 0851   PROTEINUR 1+ (A) 02/08/2022 0851   NITRITE NEGATIVE 02/08/2022 0851   LEUKOCYTESUR NEGATIVE 02/08/2022 4775  Sepsis Labs: $[id number]'[email or web address]'[id number]$ (procalcitonin:4,lacticidven:4) )No results found for this or any previous visit (from the past 240 hour(s)).   Radiological Exams on Admission: DG Chest 2 View  Result Date: 03/27/2022 CLINICAL DATA:  Chest pain.  Shortness of breath and dizziness. EXAM: CHEST - 2 VIEW COMPARISON:  12/31/2021 FINDINGS: Low lung volumes. The lungs are clear without focal pneumonia, edema, pneumothorax or pleural effusion. The cardiopericardial silhouette is within normal limits for size. The visualized bony structures of the thorax are unremarkable. IMPRESSION: Low volume film without acute cardiopulmonary findings. Electronically Signed   By: Misty Stanley M.D.   On: 03/27/2022 10:20    EKG: Independently reviewed.  Sinus rhythm.  Assessment/Plan Principal Problem:   Dyspnea on exertion Active Problems:   Essential hypertension   CKD (chronic kidney disease) stage 4, GFR 15-29 ml/min (HCC)    Exertional dyspnea cause not clear.  Since the D-dimer is elevated patient was empirically started on heparin infusion.   Check VQ scan and Dopplers of the lower extremity.  Recent 2D echo done showed EF of 60 to 65% with mild aortic stenosis and cardiologist does not feel his symptoms are due to aortic stenosis.  Patient had a recent stress test in June 2023 which showed low risk study. Hypertension -usually takes ARB labetalol.  Since potassium is elevated holding ARB for now and keeping patient on as needed IV hydralazine. Chronic kidney disease stage IV with mild hyperkalemia was given Lokelma.  Holding ARB until potassium corrects. 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 Hyperlipidemia on statins.   DVT prophylaxis: Heparin infusion. Code Status: Full code. Family Communication: Discussed with patient. Disposition Plan: Home. Consults called: ER physician discussed with patient's cardiologist. Admission status: Observation   Rise Patience MD [facility] Pager [phone].  If 7PM-7AM, please contact night-coverage [email or web address] Password [email or web address]  03/27/2022, 8:25 PM

## 2022-03-28 ENCOUNTER — Observation Stay (HOSPITAL_BASED_OUTPATIENT_CLINIC_OR_DEPARTMENT_OTHER)
Admit: 2022-03-28 | Discharge: 2022-03-28 | Disposition: A | Discharge status: Home / Self Care | Payer: Medicare Other | Attending: Internal Medicine | Admitting: Internal Medicine

## 2022-03-28 ENCOUNTER — Observation Stay (HOSPITAL_COMMUNITY): Payer: Medicare Other

## 2022-03-28 DIAGNOSIS — R609 Edema, unspecified: Secondary | ICD-10-CM | POA: Diagnosis not present

## 2022-03-28 DIAGNOSIS — R0609 Other forms of dyspnea: Secondary | ICD-10-CM | POA: Diagnosis not present

## 2022-03-28 LAB — CBC WITH DIFFERENTIAL/PLATELET
Abs Immature Granulocytes: 0.02 10*3/uL (ref 0.00–0.07)
Basophils Absolute: 0.1 10*3/uL (ref 0.0–0.1)
Basophils Relative: 1 %
Eosinophils Absolute: 0.4 10*3/uL (ref 0.0–0.5)
Eosinophils Relative: 6 %
HCT: 37.2 % — ABNORMAL LOW (ref 39.0–52.0)
Hemoglobin: 12.6 g/dL — ABNORMAL LOW (ref 13.0–17.0)
Immature Granulocytes: 0 %
Lymphocytes Relative: 44 %
Lymphs Abs: 2.8 10*3/uL (ref 0.7–4.0)
MCH: 27.8 pg (ref 26.0–34.0)
MCHC: 33.9 g/dL (ref 30.0–36.0)
MCV: 82.1 fL (ref 80.0–100.0)
Monocytes Absolute: 0.5 10*3/uL (ref 0.1–1.0)
Monocytes Relative: 8 %
Neutro Abs: 2.6 10*3/uL (ref 1.7–7.7)
Neutrophils Relative %: 41 %
Platelets: 212 10*3/uL (ref 150–400)
RBC: 4.53 MIL/uL (ref 4.22–5.81)
RDW: 14.6 % (ref 11.5–15.5)
WBC: 6.3 10*3/uL (ref 4.0–10.5)
nRBC: 0 % (ref 0.0–0.2)

## 2022-03-28 LAB — BASIC METABOLIC PANEL
Anion gap: 8 (ref 5–15)
BUN: 34 mg/dL — ABNORMAL HIGH (ref 8–23)
CO2: 19 mmol/L — ABNORMAL LOW (ref 22–32)
Calcium: 9.3 mg/dL (ref 8.9–10.3)
Chloride: 112 mmol/L — ABNORMAL HIGH (ref 98–111)
Creatinine, Ser: 2.2 mg/dL — ABNORMAL HIGH (ref 0.61–1.24)
GFR, Estimated: 29 mL/min — ABNORMAL LOW (ref >60)
Glucose, Bld: 85 mg/dL (ref 70–99)
Potassium: 4.9 mmol/L (ref 3.5–5.1)
Sodium: 139 mmol/L (ref 135–145)

## 2022-03-28 LAB — HEPARIN LEVEL (UNFRACTIONATED): Heparin Unfractionated: 1.1 IU/mL — ABNORMAL HIGH (ref 0.30–0.70)

## 2022-03-28 MED ORDER — HEPARIN (PORCINE) 25000 UT/250ML-% IV SOLN
1300.0000 [IU]/h | INTRAVENOUS | Status: DC
  Administered 2022-03-28: 1300 [IU]/h via INTRAVENOUS

## 2022-03-28 MED ORDER — TECHNETIUM TO 99M ALBUMIN AGGREGATED
4.1000 | Freq: Once | INTRAVENOUS | Status: AC | PRN
  Administered 2022-03-28: 4.1 via INTRAVENOUS

## 2022-03-28 NOTE — Progress Notes (Signed)
Left LE venous duplex study completed. Please see CV Proc for preliminary results.  Temeca Somma BS, RVT 03/28/2022 10:29 AM

## 2022-03-28 NOTE — ED Notes (Signed)
Heparin stopped per MD request

## 2022-03-28 NOTE — ED Notes (Signed)
Pt returned to Room 12

## 2022-03-28 NOTE — ED Notes (Signed)
Pt transported to [facility]

## 2022-03-28 NOTE — Progress Notes (Signed)
ANTICOAGULATION CONSULT NOTE  Pharmacy Consult for heparin Indication: pulmonary embolus  Allergies  Allergen Reactions   Cephalexin Diarrhea    Patient Measurements:   Heparin Dosing Weight: TBW  Vital Signs: Temp: 97.7 F (36.5 C) (08/15 2010) Temp Source: Oral (08/15 2010) BP: 126/78 (08/16 0400) Pulse Rate: 56 (08/16 0400)  Labs: Recent Labs    03/27/22 0909 03/27/22 1450 03/28/22 0430 03/28/22 0435  HGB 12.3*  --   --  12.6*  HCT 35.2*  --   --  37.2*  PLT 239  --   --  212  HEPARINUNFRC  --   --  1.10*  --   CREATININE 2.58*  --   --  2.20*  TROPONINIHS 6 7  --   --      CrCl cannot be calculated (Unknown ideal weight.).   Medical History: Past Medical History:  Diagnosis Date   Hypercholesterolemia    Hypertension      Assessment: 64 YOM presenting with SOB and dizziness, concern for PE awaiting V/Q scan with renal dysfunction.  He is not on anticoagulation PTA  8/16 AM update:  Heparin level elevated  No issues per RN  Goal of Therapy:  Heparin level 0.3-0.7 units/ml Monitor platelets by anticoagulation protocol: Yes   Plan:  Hold heparin x 1 hr Re-start heparin drip at 1300 units/hr 1400 heparin level  Narda Bonds, PharmD, BCPS Clinical Pharmacist Phone: [phone]

## 2022-03-28 NOTE — Discharge Summary (Signed)
Physician Discharge Summary  Gary Pierce FHQ:[id number] DOB: 29-Apr-1941 DOA: 03/27/2022  PCP: Susy Frizzle, MD  Admit date: 03/27/2022 Discharge date: 03/28/2022  Admitted From: Home Disposition: Home  Recommendations for Outpatient Follow-up:  Follow up with PCP in 1-2 weeks Keep your appointment with nephrology as previously scheduled Follow-up with cardiology, office will schedule appointment for next week.   Discharge Condition: Stable CODE STATUS: Full code Diet recommendation: Low-salt diet  Discharge summary:  81 year old gentleman with history of hypertension, chronic kidney disease stage IV, hyperlipidemia, mild intermittent asthma who is suffering from exertional dyspnea for at least 2 months progressively getting worse and sometimes associated with dizziness.  He was recently admitted to the hospital underwent 2D echocardiogram that was essentially normal, mild aortic stenosis, patient had stress test was without any evidence of ischemia.  He is following up with cardiology as outpatient.  Exertional dyspnea: Multifactorial.  No definite cause. 2D echocardiogram 5/23, normal ejection fraction.  Mild aortic stenosis. Stress test 6/23, reported normal by cardiology VQ scan lungs today with low probability of pulmonary embolism. Duplexes lower extremities without evidence of DVT. Currently on room air. Chest x-ray normal. No evidence of asthma exacerbation.  Plan: Case discussed with cardiology.  Recommending conservative management given advanced kidney disease with risk of going into hemodialysis. Patient is fairly stable and room air.  Dyspnea is mostly chronic.  Patient agreeable to continue outpatient treatment plans. Discharge home.  No change in medications done.   Discharge Diagnoses:  Principal Problem:   Dyspnea on exertion Active Problems:   Essential hypertension   CKD (chronic kidney disease) stage 4, GFR 15-29 ml/min [facility])    Discharge  Instructions  Discharge Instructions     Call MD for:  difficulty breathing, headache or visual disturbances   Complete by: As directed    Diet - low sodium heart healthy   Complete by: As directed    Increase activity slowly   Complete by: As directed       Allergies as of 03/28/2022       Reactions   Cephalexin Diarrhea        Medication List     STOP taking these medications    benzonatate 100 MG capsule Commonly known as: TESSALON   isosorbide-hydrALAZINE 20-37.5 MG tablet Commonly known as: BIDIL   Pfizer-BioNT COVID-19 Vac-TriS Susp injection Generic drug: COVID-19 mRNA Vac-TriS (Pfizer)       TAKE these medications    albuterol 108 (90 Base) MCG/ACT inhaler Commonly known as: VENTOLIN HFA Inhale 1-2 puffs into the lungs every 6 (six) hours as needed for wheezing or shortness of breath.   alfuzosin 10 MG 24 hr tablet Commonly known as: UROXATRAL Take 10 mg by mouth daily as needed.   amLODipine 10 MG tablet Commonly known as: NORVASC Take 10 mg by mouth daily.   Aspirin Low Dose 81 MG chewable tablet Generic drug: aspirin Chew 1 tablet (81 mg total) by mouth daily.   cetirizine 5 MG tablet Commonly known as: ZYRTEC Take 1 tablet (5 mg total) by mouth daily. What changed:  when to take this reasons to take this   colchicine 0.6 MG tablet Take 0.6 mg by mouth 4 (four) times daily as needed (gout).   labetalol 100 MG tablet Commonly known as: NORMODYNE Take 1 tablet (100 mg total) by mouth 2 (two) times daily.   loperamide 2 MG capsule Commonly known as: IMODIUM Take 1 capsule (2 mg total) by mouth daily as needed for diarrhea  or loose stools.   olmesartan 40 MG tablet Commonly known as: BENICAR Take 40 mg by mouth daily.   ondansetron 8 MG disintegrating tablet Commonly known as: ZOFRAN-ODT Take 1 tablet (8 mg total) by mouth every 8 (eight) hours as needed for nausea or vomiting.   rosuvastatin 10 MG tablet Commonly known as:  CRESTOR Take 1 tablet (10 mg total) by mouth daily.   silodosin 8 MG Caps capsule Commonly known as: RAPAFLO Take 8 mg by mouth daily with breakfast.   zolpidem 10 MG tablet Commonly known as: AMBIEN Take 10 mg by mouth at bedtime.        Allergies  Allergen Reactions   Cephalexin Diarrhea    Consultations: None, cardiology curbside with Dr. Virgina Jock   Procedures/Studies: VAS [place] LOWER EXTREMITY VENOUS (DVT) (ONLY [facility] & [facility])  Result Date: 03/28/2022  Lower Venous DVT Study Patient Name:  Gary Pierce  Date of Exam:   03/28/2022 Medical Rec #: [id number]    Accession #:    [id number] Date of Birth: [birth year]    Patient Gender: M Patient Age:   12 years Exam Location:  [facility] Procedure:      VAS [place] LOWER EXTREMITY VENOUS (DVT) Referring Phys: Gean Birchwood --------------------------------------------------------------------------------  Indications: Edema.  Comparison Study: No previous exam noted. Performing Technologist: Bobetta Lime BS, RVT  Examination Guidelines: A complete evaluation includes B-mode imaging, spectral Doppler, color Doppler, and power Doppler as needed of all accessible portions of each vessel. Bilateral testing is considered an integral part of a complete examination. Limited examinations for reoccurring indications may be performed as noted. The reflux portion of the exam is performed with the patient in reverse Trendelenburg.  +-----+---------------+---------+-----------+----------+--------------+ RIGHTCompressibilityPhasicitySpontaneityPropertiesThrombus Aging +-----+---------------+---------+-----------+----------+--------------+ CFV  Full           Yes      Yes                                 +-----+---------------+---------+-----------+----------+--------------+   +---------+---------------+---------+-----------+----------+--------------+ LEFT     CompressibilityPhasicitySpontaneityPropertiesThrombus Aging  +---------+---------------+---------+-----------+----------+--------------+ CFV      Full           Yes      Yes                                 +---------+---------------+---------+-----------+----------+--------------+ SFJ      Full                                                        +---------+---------------+---------+-----------+----------+--------------+ FV Prox  Full                                                        +---------+---------------+---------+-----------+----------+--------------+ FV Mid   Full                                                        +---------+---------------+---------+-----------+----------+--------------+  FV DistalFull                                                        +---------+---------------+---------+-----------+----------+--------------+ PFV      Full                                                        +---------+---------------+---------+-----------+----------+--------------+ POP      Full           Yes      Yes                                 +---------+---------------+---------+-----------+----------+--------------+ PTV      Full                                                        +---------+---------------+---------+-----------+----------+--------------+ PERO     Full                                                        +---------+---------------+---------+-----------+----------+--------------+     Summary: RIGHT: - No evidence of common femoral vein obstruction.  LEFT: - No evidence of deep vein thrombosis in the lower extremity. No indirect evidence of obstruction proximal to the inguinal ligament. - No cystic structure found in the popliteal fossa.  *See table(s) above for measurements and observations.    Preliminary    NM Pulmonary Perfusion  Result Date: 03/28/2022 CLINICAL DATA:  Rule out pulmonary embolism. Shortness of breath and dyspnea. EXAM: NUCLEAR MEDICINE PERFUSION LUNG  SCAN TECHNIQUE: Perfusion images were obtained in multiple projections after intravenous injection of radiopharmaceutical. Ventilation scans intentionally deferred if perfusion scan and chest x-ray adequate for interpretation during COVID 19 epidemic. RADIOPHARMACEUTICALS:  4.1 mCi Tc-32m MAA IV COMPARISON:  Chest radiograph 03/27/2022 FINDINGS: Uniform distribution of the radiopharmaceutical identified. No peripheral segmental perfusion defects to suggest presence of acute pulmonary embolus. On the lateral projection radiographs there is non uniform soft tissue attenuation artifact is identified involving the posterior lungs. IMPRESSION: 1. No evidence for acute pulmonary embolus. Electronically Signed   By: Kerby Moors M.D.   On: 03/28/2022 08:32   DG Chest 2 View  Result Date: 03/27/2022 CLINICAL DATA:  Chest pain.  Shortness of breath and dizziness. EXAM: CHEST - 2 VIEW COMPARISON:  12/31/2021 FINDINGS: Low lung volumes. The lungs are clear without focal pneumonia, edema, pneumothorax or pleural effusion. The cardiopericardial silhouette is within normal limits for size. The visualized bony structures of the thorax are unremarkable. IMPRESSION: Low volume film without acute cardiopulmonary findings. Electronically Signed   By: Misty Stanley M.D.   On: 03/27/2022 10:20   (Echo, Carotid, EGD, Colonoscopy, ERCP)    Subjective: Patient seen and examined.  In the emergency room.  Denies any  complaints at rest.  Getting around and walking.  Does get short of breath on walking about a block.  Denies any chest pain.   Discharge Exam: Vitals:   03/28/22 1200 03/28/22 1227  BP: 127/70 127/70  Pulse:    Resp: (!) 23 16  Temp:  98.1 F (36.7 C)  SpO2:  96%   Vitals:   03/28/22 0914 03/28/22 0930 03/28/22 1200 03/28/22 1227  BP:  (!) 147/72 127/70 127/70  Pulse: 65 (!) 134    Resp:  (!) 24 (!) 23 16  Temp:  97.8 F (36.6 C)  98.1 F (36.7 C)  TempSrc:  Oral  Oral  SpO2:  (!) 71%  96%     General: Pt is alert, awake, not in acute distress Cardiovascular: RRR, S1/S2 +, no rubs, no gallops Respiratory: CTA bilaterally, no wheezing, no rhonchi Abdominal: Soft, NT, ND, bowel sounds + Extremities: no edema, no cyanosis    The results of significant diagnostics from this hospitalization (including imaging, microbiology, ancillary and laboratory) are listed below for reference.     Microbiology: No results found for this or any previous visit (from the past 240 hour(s)).   Labs: BNP (last 3 results) Recent Labs    12/31/21 0634 01/01/22 0719 03/27/22 1750  BNP 74.7 138.4* 72.5   Basic Metabolic Panel: Recent Labs  Lab 03/27/22 0909 03/28/22 0435  NA 138 139  K 5.3* 4.9  CL 109 112*  CO2 23 19*  GLUCOSE 102* 85  BUN 38* 34*  CREATININE 2.58* 2.20*  CALCIUM 9.6 9.3   Liver Function Tests: Recent Labs  Lab 03/27/22 0909  AST 20  ALT 18  ALKPHOS 64  BILITOT 0.5  PROT 6.8  ALBUMIN 3.8   No results for input(s): "LIPASE", "AMYLASE" in the last 168 hours. No results for input(s): "AMMONIA" in the last 168 hours. CBC: Recent Labs  Lab 03/27/22 0909 03/28/22 0435  WBC 5.7 6.3  NEUTROABS  --  2.6  HGB 12.3* 12.6*  HCT 35.2* 37.2*  MCV 80.0 82.1  PLT 239 212   Cardiac Enzymes: No results for input(s): "CKTOTAL", "CKMB", "CKMBINDEX", "TROPONINI" in the last 168 hours. BNP: Invalid input(s): "POCBNP" CBG: No results for input(s): "GLUCAP" in the last 168 hours. D-Dimer Recent Labs    03/27/22 1750  DDIMER 1.04*   Hgb A1c No results for input(s): "HGBA1C" in the last 72 hours. Lipid Profile No results for input(s): "CHOL", "HDL", "LDLCALC", "TRIG", "CHOLHDL", "LDLDIRECT" in the last 72 hours. Thyroid function studies No results for input(s): "TSH", "T4TOTAL", "T3FREE", "THYROIDAB" in the last 72 hours.  Invalid input(s): "FREET3" Anemia work up No results for input(s): "VITAMINB12", "FOLATE", "FERRITIN", "TIBC", "IRON", "RETICCTPCT"  in the last 72 hours. Urinalysis    Component Value Date/Time   COLORURINE YELLOW 02/08/2022 0851   APPEARANCEUR CLEAR 02/08/2022 0851   LABSPEC 1.016 02/08/2022 0851   PHURINE < OR = 5.0 02/08/2022 0851   GLUCOSEU NEGATIVE 02/08/2022 0851   HGBUR NEGATIVE 02/08/2022 0851   KETONESUR NEGATIVE 02/08/2022 0851   PROTEINUR 1+ (A) 02/08/2022 0851   NITRITE NEGATIVE 02/08/2022 0851   LEUKOCYTESUR NEGATIVE 02/08/2022 0851   Sepsis Labs Recent Labs  Lab 03/27/22 0909 03/28/22 0435  WBC 5.7 6.3   Microbiology No results found for this or any previous visit (from the past 240 hour(s)).   Time coordinating discharge: 28 minutes  SIGNED:   Barb Merino, MD  [facility] 03/28/2022, 12:56 PM

## 2022-04-16 ENCOUNTER — Ambulatory Visit (HOSPITAL_COMMUNITY)
Admission: EM | Admit: 2022-04-16 | Discharge: 2022-04-16 | Disposition: A | Discharge status: Home / Self Care | Payer: Medicare Other | Attending: Family Medicine | Admitting: Family Medicine

## 2022-04-16 ENCOUNTER — Telehealth (HOSPITAL_COMMUNITY): Payer: Self-pay | Admitting: Family Medicine

## 2022-04-16 DIAGNOSIS — J069 Acute upper respiratory infection, unspecified: Secondary | ICD-10-CM

## 2022-04-16 DIAGNOSIS — U071 COVID-19: Secondary | ICD-10-CM | POA: Insufficient documentation

## 2022-04-16 DIAGNOSIS — J449 Chronic obstructive pulmonary disease, unspecified: Secondary | ICD-10-CM | POA: Diagnosis present

## 2022-04-16 LAB — SARS CORONAVIRUS 2 BY RT PCR: SARS Coronavirus 2 by RT PCR: POSITIVE — AB

## 2022-04-16 MED ORDER — CHERATUSSIN AC 100-10 MG/5ML PO SOLN: 5.0000 mL | Freq: Four times a day (QID) | ORAL | 0 refills | Status: DC | PRN

## 2022-04-16 MED ORDER — MOLNUPIRAVIR EUA 200MG CAPSULE
4.0000 | ORAL_CAPSULE | Freq: Two times a day (BID) | ORAL | 0 refills | Status: AC
Start: 2022-04-16 — End: 2022-04-21

## 2022-04-16 MED ORDER — ALBUTEROL SULFATE HFA 108 (90 BASE) MCG/ACT IN AERS: 2.0000 | INHALATION_SPRAY | RESPIRATORY_TRACT | 0 refills | Status: DC | PRN

## 2022-04-16 NOTE — ED Triage Notes (Signed)
Pt has a cough, sore throat , body aches , can catch breath x 1day

## 2022-04-16 NOTE — ED Provider Notes (Signed)
[place]    CSN: [id number] Arrival date & time: 04/16/22  1238      History   Chief Complaint Chief Complaint  Patient presents with   Cough    HPI Gary Pierce is a 81 y.o. male.    Cough  Here with a history of cough, congestion, and a little shortness of breath.  Symptoms began yesterday.  He has not had any fever but he has maybe had some chills.  He has also been aching.  No vomiting or diarrhea but he has possibly had some nausea, as he has not eaten anything today and is drunk very little fluids.  He did try his inhaler at home and it did help his shortness of breath.  Currently he is not very short of breath. He does have a history of COPD  His renal function is reduced with a creatinine of 2.2 and a EGFR of 29  Past Medical History:  Diagnosis Date   Hypercholesterolemia    Hypertension     Patient Active Problem List   Diagnosis Date Noted   Dyspnea on exertion 03/27/2022   CKD (chronic kidney disease) stage 4, GFR 15-29 ml/min (HCC) 12/31/2021   Chest pain 12/30/2021   Exertional dyspnea 08/17/2015   Shortness of breath 08/15/2015    Class: Acute   History of gout 07/19/2014   Essential hypertension 07/19/2014   BPH (benign prostatic hyperplasia) 07/19/2014   Mixed hyperlipidemia 07/19/2014   Asthma, chronic 07/19/2014   Erectile dysfunction 07/19/2014   Pain in limb 10/02/2012   Varicose veins of lower extremities with other complications 58/83/2549    Past Surgical History:  Procedure Laterality Date   COLONOSCOPY     2015  due in 2017       Home Medications    Prior to Admission medications   Medication Sig Start Date End Date Taking? Authorizing Provider  guaiFENesin-codeine (CHERATUSSIN AC) 100-10 MG/5ML syrup Take 5 mLs by mouth 4 (four) times daily as needed for cough. 04/16/22  Yes Sharlyn Odonnel, Gwenlyn Perking, MD  albuterol (VENTOLIN HFA) 108 (90 Base) MCG/ACT inhaler Inhale 2 puffs into the lungs every 4 (four) hours as needed  for wheezing or shortness of breath. 04/16/22   Barrett Henle, MD  alfuzosin (UROXATRAL) 10 MG 24 hr tablet Take 10 mg by mouth daily as needed. 10/07/21   [provider]  amLODipine (NORVASC) 10 MG tablet Take 10 mg by mouth daily. 10/03/20   [provider]  aspirin 81 MG chewable tablet Chew 1 tablet (81 mg total) by mouth daily. 01/01/22   Thurnell Lose, MD  cetirizine (ZYRTEC) 5 MG tablet Take 1 tablet (5 mg total) by mouth daily. Patient taking differently: Take 5 mg by mouth daily as needed for allergies. 11/03/20   Jaynee Eagles, PA-C  colchicine 0.6 MG tablet Take 0.6 mg by mouth 4 (four) times daily as needed (gout). 10/23/21   [provider]  labetalol (NORMODYNE) 100 MG tablet Take 1 tablet (100 mg total) by mouth 2 (two) times daily. 02/05/22   Patwardhan, Reynold Bowen, MD  loperamide (IMODIUM) 2 MG capsule Take 1 capsule (2 mg total) by mouth daily as needed for diarrhea or loose stools. 08/16/20   Jaynee Eagles, PA-C  olmesartan (BENICAR) 40 MG tablet Take 40 mg by mouth daily. 10/10/21   [provider]  ondansetron (ZOFRAN-ODT) 8 MG disintegrating tablet Take 1 tablet (8 mg total) by mouth every 8 (eight) hours as needed for nausea  or vomiting. 08/16/20   Jaynee Eagles, PA-C  rosuvastatin (CRESTOR) 10 MG tablet Take 1 tablet (10 mg total) by mouth daily. 01/01/22   Thurnell Lose, MD  silodosin (RAPAFLO) 8 MG CAPS capsule Take 8 mg by mouth daily with breakfast.    [provider]  zolpidem (AMBIEN) 10 MG tablet Take 10 mg by mouth at bedtime. 12/30/21   [provider]    Family History Family History  Problem Relation Age of Onset   Cancer Mother    Heart disease Father    Hyperlipidemia Father    Hypertension Father    Heart attack Father     Social History Social History   Tobacco Use   Smoking status: Former    Years: 25.00    Types: Cigarettes    Quit date: 09/18/2012    Years since quitting: 9.5   Smokeless tobacco:  Never  Vaping Use   Vaping Use: Never used  Substance Use Topics   Alcohol use: No    Alcohol/week: 0.0 standard drinks of alcohol   Drug use: No     Allergies   Cephalexin   Review of Systems Review of Systems  Respiratory:  Positive for cough.      Physical Exam Triage Vital Signs ED Triage Vitals  Enc Vitals Group     BP 04/16/22 1323 136/77     Pulse Rate 04/16/22 1323 82     Resp 04/16/22 1323 20     Temp --      Temp src --      SpO2 04/16/22 1323 98 %     Weight 04/16/22 1320 190 lb (86.2 kg)     Height --      Head Circumference --      Peak Flow --      Pain Score 04/16/22 1320 0     Pain Loc --      Pain Edu? --      Excl. in [facility]? --    No data found.  Updated Vital Signs BP 136/77 (BP Location: Left Arm)   Pulse 82   Resp 20   Wt 86.2 kg   SpO2 98%   BMI 27.26 kg/m   Visual Acuity Right Eye Distance:   Left Eye Distance:   Bilateral Distance:    Right Eye Near:   Left Eye Near:    Bilateral Near:     Physical Exam Vitals reviewed.  Constitutional:      General: He is not in acute distress.    Appearance: He is not ill-appearing, toxic-appearing or diaphoretic.  HENT:     Right Ear: Tympanic membrane and ear canal normal.     Left Ear: Tympanic membrane and ear canal normal.     Nose: Congestion present.     Mouth/Throat:     Mouth: Mucous membranes are moist.     Pharynx: No oropharyngeal exudate or posterior oropharyngeal erythema.  Eyes:     Extraocular Movements: Extraocular movements intact.     Conjunctiva/sclera: Conjunctivae normal.     Pupils: Pupils are equal, round, and reactive to light.  Cardiovascular:     Rate and Rhythm: Normal rate and regular rhythm.     Heart sounds: No murmur heard. Pulmonary:     Effort: No respiratory distress.     Breath sounds: No stridor. No wheezing, rhonchi or rales.  Musculoskeletal:     Cervical back: Neck supple.  Lymphadenopathy:     Cervical: No cervical adenopathy.  Skin:     Coloration: Skin is not jaundiced or pale.  Neurological:     General: No focal deficit present.     Mental Status: He is oriented to person, place, and time.  Psychiatric:        Behavior: Behavior normal.      UC Treatments / Results  Labs (all labs ordered are listed, but only abnormal results are displayed) Labs Reviewed  SARS CORONAVIRUS 2 BY RT PCR    EKG   Radiology No results found.  Procedures Procedures (including critical care time)  Medications Ordered in UC Medications - No data to display  Initial Impression / Assessment and Plan / UC Course  I have reviewed the triage vital signs and the nursing notes.  Pertinent labs & imaging results that were available during my care of the patient were reviewed by me and considered in my medical decision making (see chart for details).     We will swab for COVID, and if positive he would benefit from a prescription for molnupiravir. Prescriptions are sent for an inhaler and for medication for cough.  Final Clinical Impressions(s) / UC Diagnoses   Final diagnoses:  Viral URI with cough  Chronic obstructive pulmonary disease, unspecified COPD type ([place])     Discharge Instructions      Albuterol inhaler--do 2 puffs every 4 hours as needed for shortness of breath or wheezing  Robitussin with codeine cough syrup--take 1 teaspoon or 5 mL every 6 hours as needed for cough.  This medication can make you drowsy or dizzy   You have been swabbed for COVID, and the test will result in the next 24 hours. Our staff will call you if positive. If the test is positive, you should quarantine for 5 days from the start of your symptoms      ED Prescriptions     Medication Sig Dispense Auth. Provider   albuterol (VENTOLIN HFA) 108 (90 Base) MCG/ACT inhaler Inhale 2 puffs into the lungs every 4 (four) hours as needed for wheezing or shortness of breath. 18 g Barrett Henle, MD   guaiFENesin-codeine [facility])  100-10 MG/5ML syrup Take 5 mLs by mouth 4 (four) times daily as needed for cough. 120 mL Barrett Henle, MD      I have reviewed the PDMP during this encounter.   Barrett Henle, MD 04/16/22 [phone]

## 2022-04-16 NOTE — Discharge Instructions (Addendum)
Albuterol inhaler--do 2 puffs every 4 hours as needed for shortness of breath or wheezing  Robitussin with codeine cough syrup--take 1 teaspoon or 5 mL every 6 hours as needed for cough.  This medication can make you drowsy or dizzy   You have been swabbed for COVID, and the test will result in the next 24 hours. Our staff will call you if positive. If the test is positive, you should quarantine for 5 days from the start of your symptoms

## 2022-04-16 NOTE — Telephone Encounter (Signed)
Results came back with a positive COVID.  His creatinine is elevated and his EGFR was around 30.  I spoke with him by phone and verified his identity and gave results.Gary Pierce is [facility] [address] and he will try to see if he can start it tonight.

## 2022-04-20 ENCOUNTER — Ambulatory Visit: Payer: Medicare Other | Admitting: Cardiology

## 2022-05-02 ENCOUNTER — Other Ambulatory Visit: Payer: Medicare Other

## 2022-05-02 ENCOUNTER — Ambulatory Visit: Payer: Medicare Other | Admitting: Cardiology

## 2022-05-02 ENCOUNTER — Encounter: Payer: Self-pay | Admitting: Cardiology

## 2022-05-02 VITALS — BP 152/70 | HR 66 | Temp 97.6°F | Resp 16 | Ht 70.0 in | Wt 187.7 lb

## 2022-05-02 DIAGNOSIS — E782 Mixed hyperlipidemia: Secondary | ICD-10-CM

## 2022-05-02 DIAGNOSIS — R002 Palpitations: Secondary | ICD-10-CM

## 2022-05-02 DIAGNOSIS — I10 Essential (primary) hypertension: Secondary | ICD-10-CM

## 2022-05-02 DIAGNOSIS — R0609 Other forms of dyspnea: Secondary | ICD-10-CM

## 2022-05-02 DIAGNOSIS — I350 Nonrheumatic aortic (valve) stenosis: Secondary | ICD-10-CM

## 2022-05-02 NOTE — Progress Notes (Signed)
Follow up visit  Subjective:   Gary Pierce, male    DOB: [birth year], 81 y.o.   MRN: [id number]   HPI  Chief Complaint  Patient presents with   Follow-up    80 y.o. African-American male  with hypertension, hyperlipidemia, CKD stage IV, former smoker,   Patient had hospitalization in 03/2022 with symptoms of exertional dyspnea.  Again, echocardiogram showed normal EF, mild AS.  VQ scan low probability of pulmonary embolism.  There was no evidence of DVT.  He was recommended outpatient follow-up with me.  Subsequently, patient had COVID in first week of September 2023. He is still recovering from [place]. Even before COVID, he has had exertional dyspnea with walking to the mailbox. He wonders if he could have Afib, as [facility] had. He has experienced palpitations on walking.    Current Outpatient Medications:    albuterol (VENTOLIN HFA) 108 (90 Base) MCG/ACT inhaler, Inhale 2 puffs into the lungs every 4 (four) hours as needed for wheezing or shortness of breath., Disp: 18 g, Rfl: 0   alfuzosin (UROXATRAL) 10 MG 24 hr tablet, Take 10 mg by mouth daily as needed., Disp: , Rfl:    amLODipine (NORVASC) 10 MG tablet, Take 10 mg by mouth daily., Disp: , Rfl:    aspirin 81 MG chewable tablet, Chew 1 tablet (81 mg total) by mouth daily., Disp: 30 tablet, Rfl: 0   cetirizine (ZYRTEC) 5 MG tablet, Take 1 tablet (5 mg total) by mouth daily. (Patient taking differently: Take 5 mg by mouth daily as needed for allergies.), Disp: 30 tablet, Rfl: 0   colchicine 0.6 MG tablet, Take 0.6 mg by mouth 4 (four) times daily as needed (gout)., Disp: , Rfl:    labetalol (NORMODYNE) 100 MG tablet, Take 1 tablet (100 mg total) by mouth 2 (two) times daily., Disp: 60 tablet, Rfl: 3   rosuvastatin (CRESTOR) 10 MG tablet, Take 1 tablet (10 mg total) by mouth daily., Disp: 30 tablet, Rfl: 0   silodosin (RAPAFLO) 8 MG CAPS capsule, Take 8 mg by mouth daily with breakfast., Disp: , Rfl:    loperamide (IMODIUM)  2 MG capsule, Take 1 capsule (2 mg total) by mouth daily as needed for diarrhea or loose stools. (Patient not taking: Reported on 05/02/2022), Disp: 10 capsule, Rfl: 0   olmesartan (BENICAR) 40 MG tablet, Take 40 mg by mouth daily. (Patient not taking: Reported on 05/02/2022), Disp: , Rfl:    ondansetron (ZOFRAN-ODT) 8 MG disintegrating tablet, Take 1 tablet (8 mg total) by mouth every 8 (eight) hours as needed for nausea or vomiting. (Patient not taking: Reported on 05/02/2022), Disp: 20 tablet, Rfl: 0   zolpidem (AMBIEN) 10 MG tablet, Take 10 mg by mouth at bedtime. (Patient not taking: Reported on 05/02/2022), Disp: , Rfl:    Cardiovascular & other pertient studies:  Reviewed external labs and tests, independently interpreted  EKG 12/30/2021: Sinus rhythm Atrial premature complexes Prolonged PR interval Left axis deviation Abnormal R-wave progression, early transition  Exercise Tetrofosmin stress test 01/22/2022: Exercise nuclear stress test was performed using Bruce protocol. Patient reached 7 METS, and 107% of age predicted maximum heart rate. Exercise capacity was low. No chest pain reported. Resting hypertension 158/90 mmHg with normal hemodynamic response. Stress EKG revealed no ischemic changes. Decreased tracer uptake in inferior myocardium at rest and stress, without any adjacent wall motion abnormality. Findings likely due to diaphragmatic attenuation.  Stress LVEF calculated 46%, but visually appears normal.  Stress LVEF calculated 46%,  but visually appears normal. Low risk study.  Echocardiogram 12/31/2021: 1. Left ventricular ejection fraction, by estimation, is 60 to 65%. The  left ventricle has normal function. The left ventricle has no regional  wall motion abnormalities. Left ventricular diastolic parameters were  normal.   2. Right ventricular systolic function is low normal. The right  ventricular size is normal.   3. The mitral valve is normal in structure. No evidence of  mitral valve  regurgitation. No evidence of mitral stenosis.   4. The aortic valve is tricuspid. There is mild calcification of the  aortic valve. Aortic valve regurgitation is not visualized. Mild aortic  valve stenosis. Aortic valve Vmax measures 1.76 m/s.     Recent labs: 03/28/2022: Glucose 85, BUN/Cr 34/2.2. EGFR 29. Na/K 139/4.9.  H/H 12/37. MCV 82. Platelets 212  01/01/2022: Glucose 93, BUN/Cr 25/1.92. EGFR 34. Na/K 140/4.7. Albumin 3.3, Protein 6.1. Rest of the CMP normal H/H 11/34. MCV 81. Platelets 235 HbA1C 5.9% Chol 223, TG 191, HDL 38, LDL 147 BNP 138    Review of Systems  Cardiovascular:  Positive for dyspnea on exertion. Negative for chest pain, leg swelling, palpitations and syncope.         Vitals:   05/02/22 1125  BP: (!) 152/70  Pulse: 66  Resp: 16  Temp: 97.6 F (36.4 C)  SpO2: 98%    Body mass index is 26.93 kg/m. Filed Weights   05/02/22 1125  Weight: 187 lb 11.2 oz (85.1 kg)     Objective:   Physical Exam Vitals and nursing note reviewed.  Constitutional:      General: He is not in acute distress. Neck:     Vascular: No JVD.  Cardiovascular:     Rate and Rhythm: Normal rate and regular rhythm.     Heart sounds: Normal heart sounds. No murmur heard. Pulmonary:     Effort: Pulmonary effort is normal.     Breath sounds: Normal breath sounds. No wheezing or rales.  Musculoskeletal:     Right lower leg: No edema.     Left lower leg: No edema.             Visit diagnoses:   ICD-10-CM   1. Exertional dyspnea  R06.09     2. Essential hypertension  I10     3. Mixed hyperlipidemia  E78.2     4. Nonrheumatic aortic valve stenosis  I35.0     5. Palpitations  R00.2 LONG TERM MONITOR (3-14 DAYS)        Assessment & Recommendations:   81 y.o. African-American male  with hypertension, hyperlipidemia, CKD stage IV, former smoker, with exertional dyspnea   Exertional dyspnea: No clear etiology. Symptoms of dyspnea and BNP  elevation in the pas, but normal systolic and diastolic function. No ischemia on stress testing (01/2022) Continue BiDil 20/37.5 mg 3 times daily for management of hypertension. Given his CKD, would like to avoid invasive work-up, as far as possible. For the same reason, I recommended stopping meloxicam.   In absence of known ASCVD okay to discontinue Aspirin as well.   Reasonable to check 2 week cardiac telemetry to look for Afib. If negative, will consider cardio yes pulmonary exercise testing.   Mixed hyperlipidemia: LDL 147 on Crestor 10 mg daily. Unsure if he is taking. Will revisit at next appt.   F/u in 4 weeks     Nigel Mormon, MD Pager: [phone] Office: [phone]

## 2022-05-04 ENCOUNTER — Other Ambulatory Visit: Payer: Self-pay | Admitting: Cardiology

## 2022-05-04 DIAGNOSIS — I10 Essential (primary) hypertension: Secondary | ICD-10-CM

## 2022-05-09 ENCOUNTER — Ambulatory Visit: Payer: Medicare Other | Admitting: Cardiology

## 2022-05-11 ENCOUNTER — Other Ambulatory Visit: Payer: Self-pay | Admitting: Family Medicine

## 2022-05-11 ENCOUNTER — Telehealth: Payer: Self-pay

## 2022-05-11 MED ORDER — ZOLPIDEM TARTRATE 10 MG PO TABS: 10.0000 mg | ORAL_TABLET | Freq: Every day | ORAL | 1 refills | Status: DC

## 2022-05-11 NOTE — Telephone Encounter (Signed)
Pt called requesting refill on his Ambien 10 mg. Last RF 12/30/2021. Last OV 02/08/2022.  [facility] [address] Thank you.

## 2022-06-01 IMAGING — DX DG CHEST 1V PORT
1 series · 1 of 1 positions shown · non-contrast
Comparison: Chest radiographs 12/30/2021 and earlier.

CLINICAL DATA: 80-year-old male with chest pain and shortness of
breath.

EXAM:
PORTABLE CHEST 1 VIEW

[chest ap]
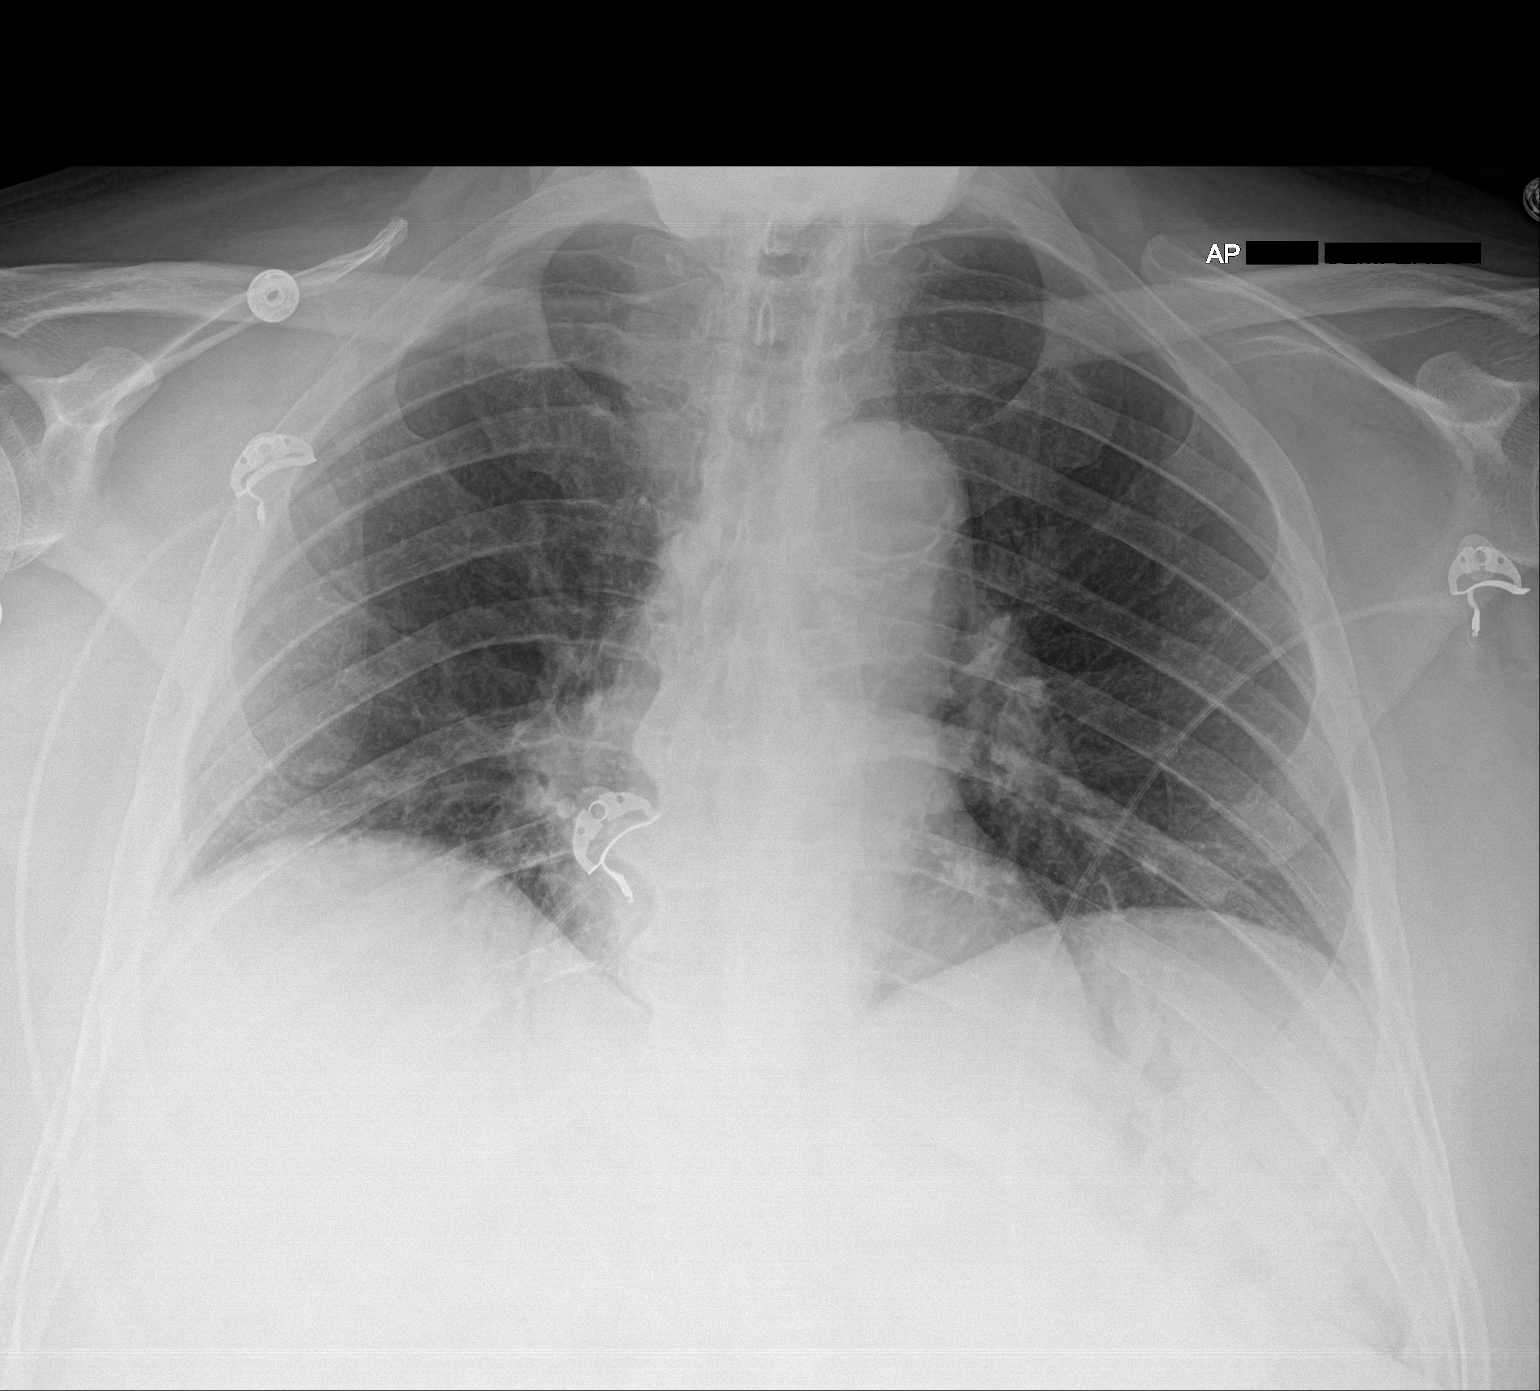

[1 of 1 positions shown; findings below may reference images not displayed]

FINDINGS: Portable AP semi upright view at 6456 hours. Lower lung volumes.
Mediastinal contours remain normal. Mild crowding of lung markings
at the lung bases more so the right. No pneumothorax, pulmonary
edema, pleural effusion, or consolidation. Visualized tracheal air
column is within normal limits. No acute osseous abnormality
identified. Paucity of bowel gas now in the upper abdomen.
IMPRESSION: Lower lung volumes with atelectasis.

No other acute cardiopulmonary abnormality.

## 2022-06-01 IMAGING — US US RENAL
1 series · 14 of 25 positions shown · non-contrast
Comparison: December 20, 2017 renal ultrasound

CLINICAL DATA: Acute renal insufficiency

EXAM:
RENAL / URINARY TRACT ULTRASOUND COMPLETE

[Series 1: us renal · 14 of 40 slices shown]
[im 1/40]
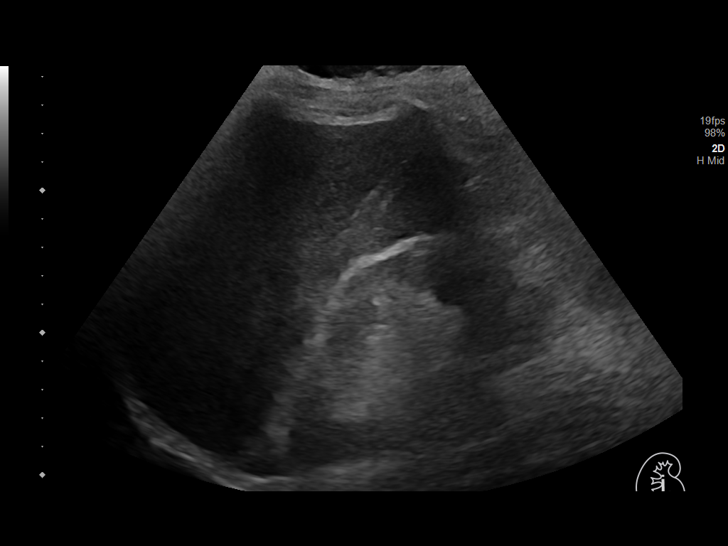
[im 4/40]
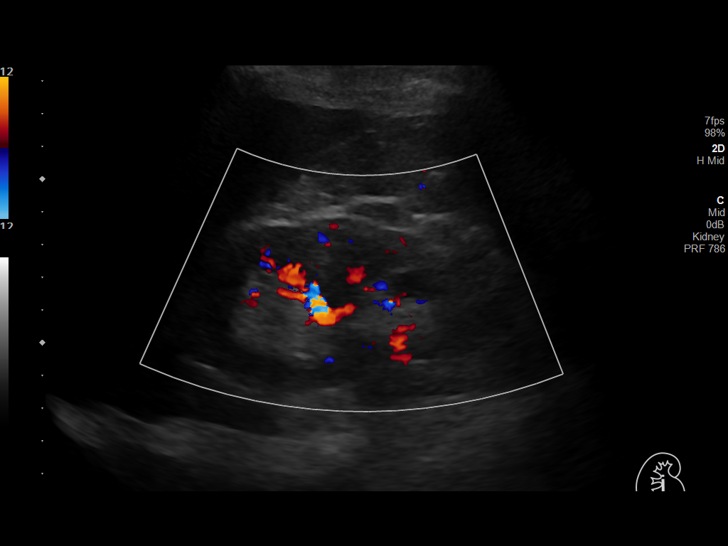
[im 7/40]
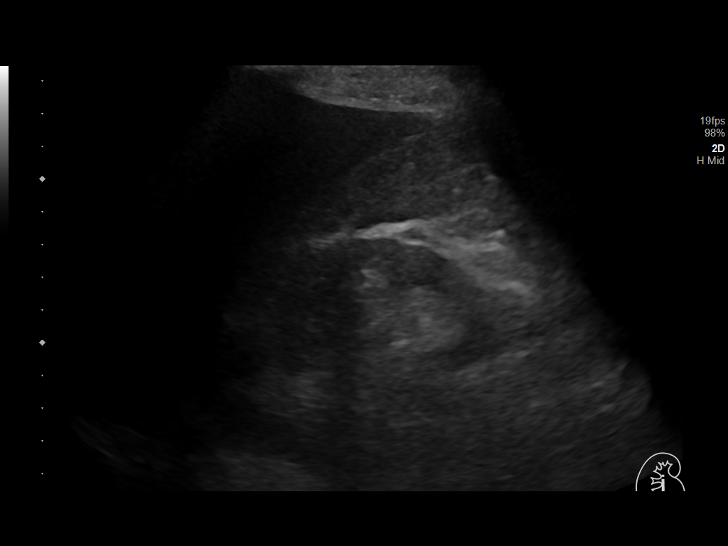
[im 10/40]
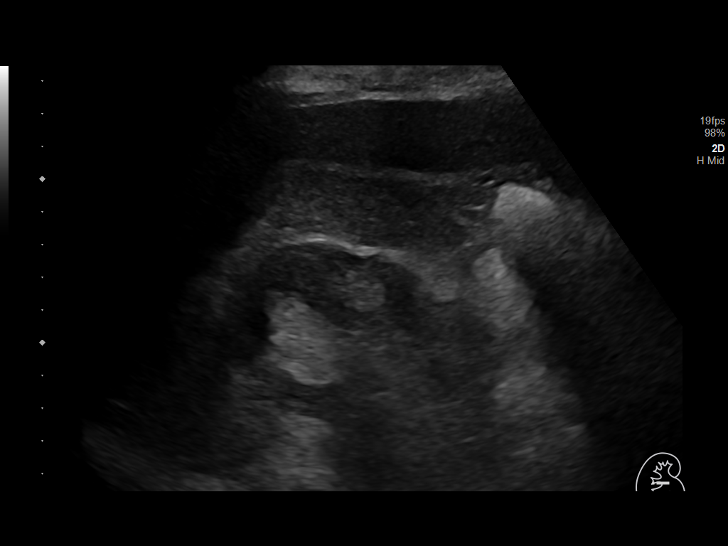
[im 14/40]
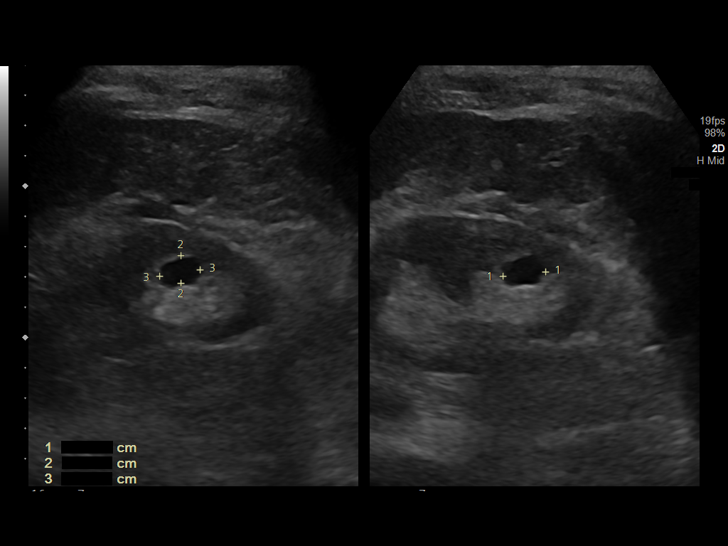
[im 15/40]
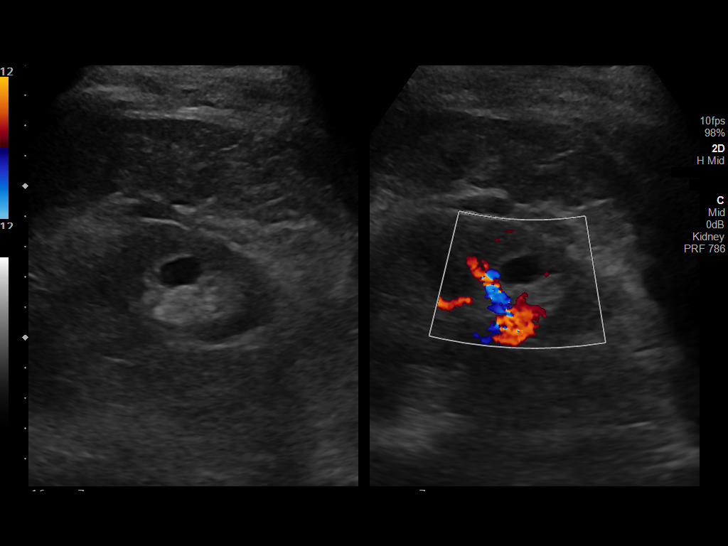
[im 18/40]
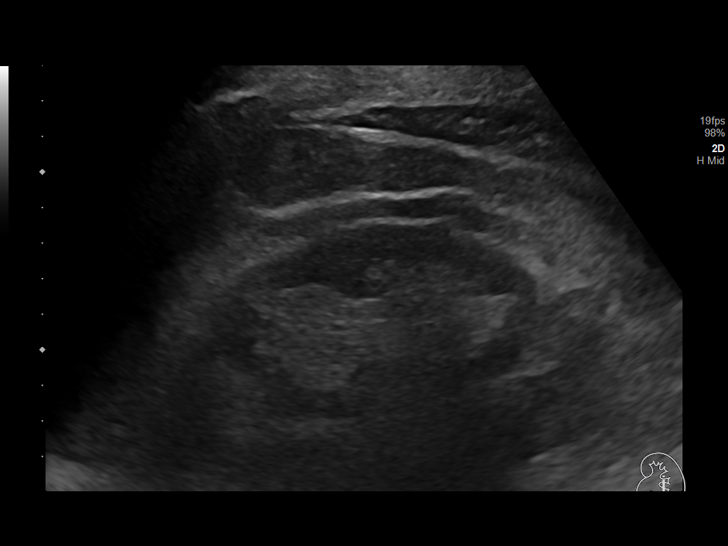
[im 22/40]
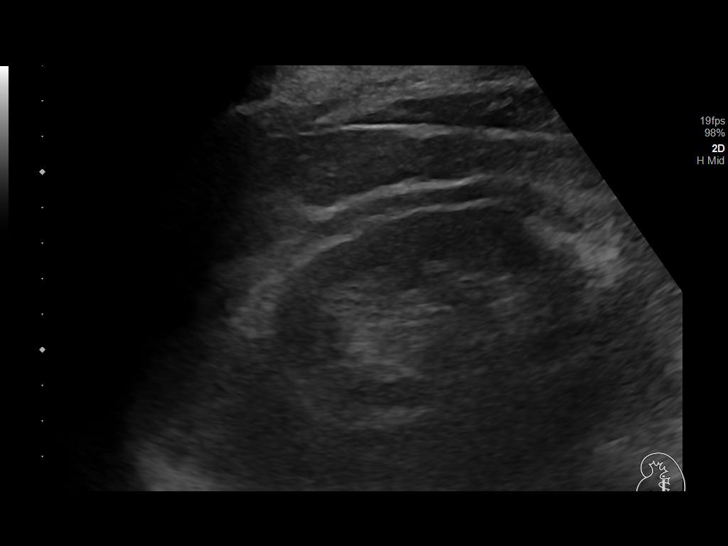
[im 25/40]
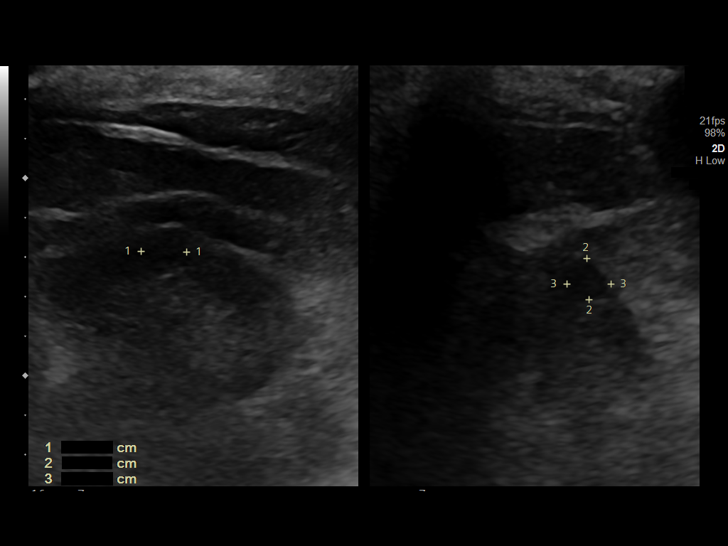
[im 27/40]
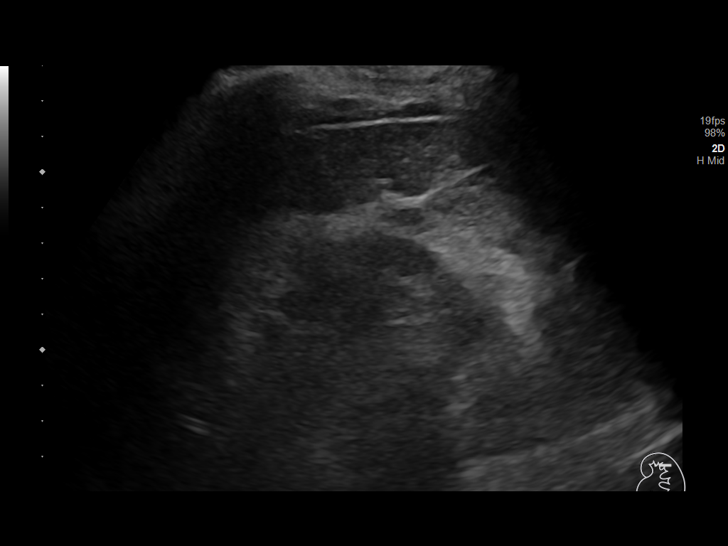
[im 30/40]
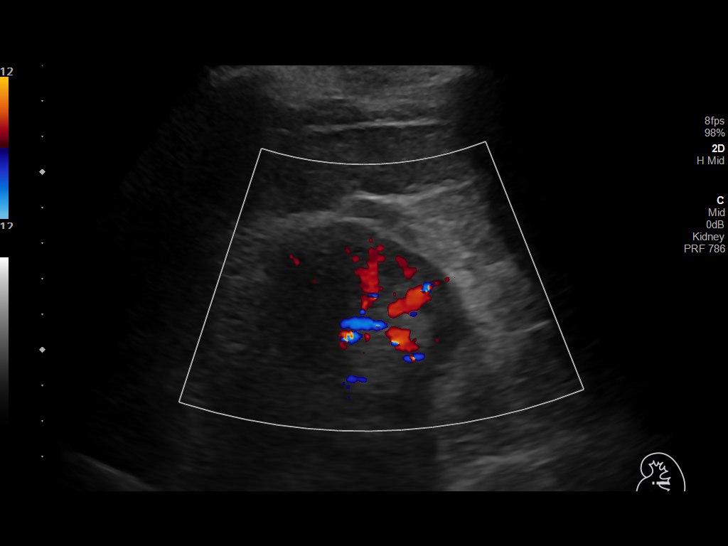
[im 33/40]
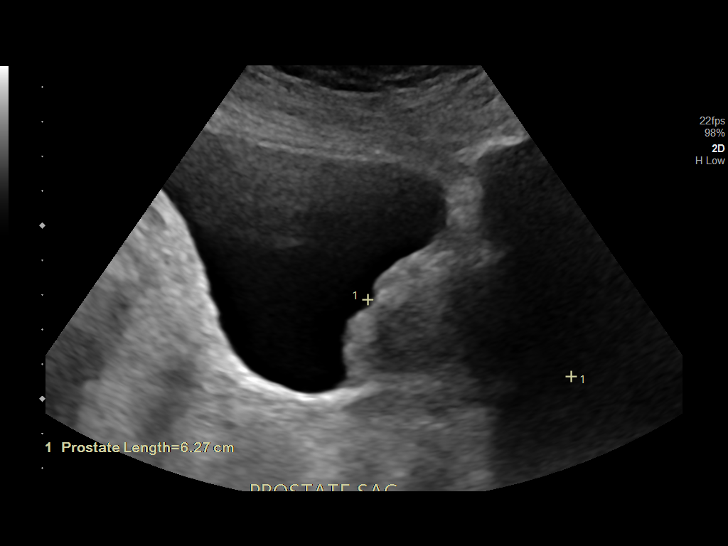
[im 36/40]
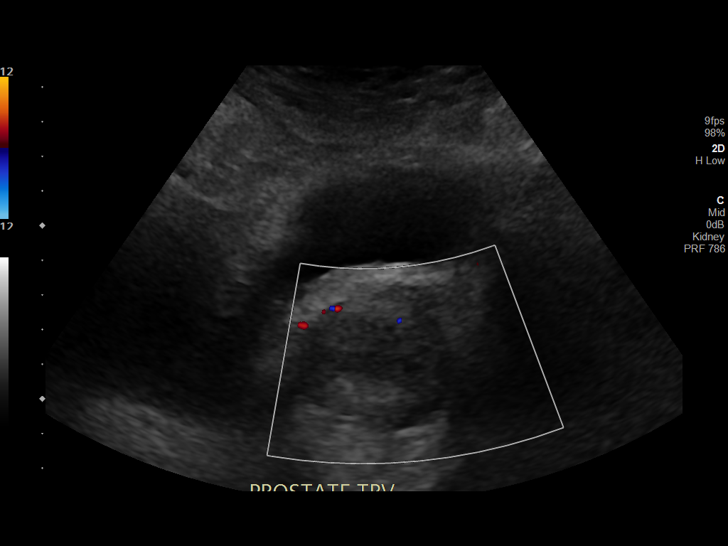
[im 40/40]
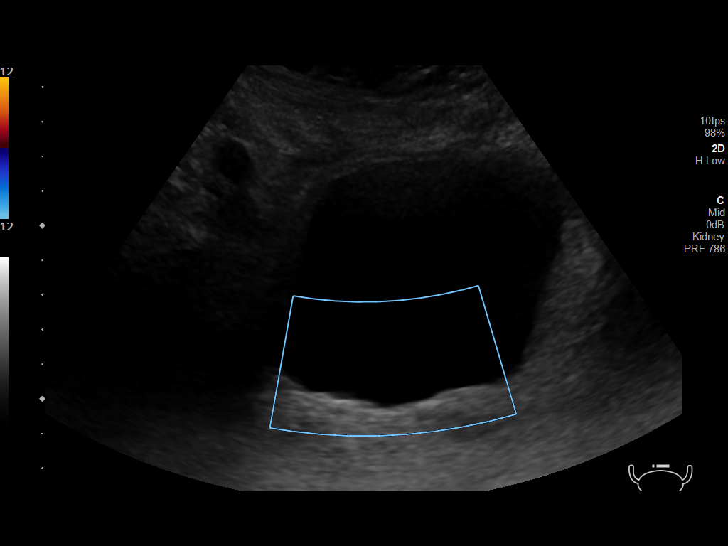

[14 of 25 positions shown; findings below may reference images not displayed]

FINDINGS: Right Kidney:

Renal measurements: 9.6 x 4.8 x 5 4 cm = volume: 120 mL. Contains a
1.4 cm simple cyst of no clinical significance. No follow-up imaging
recommended the cyst.

Left Kidney:

Renal measurements: 9.4 x 5.3 x 5.6 cm = volume: 140 mL. Contains a
small 12 mm cyst at the upper pole no clinical significance. No
follow-up imaging recommended for the cyst.

Bladder:

Appears normal for degree of bladder distention.

Other:

The prostate is enlarged with a volume of 60.7 cc.
IMPRESSION: 1. The kidneys and bladder are unremarkable.
2. Prostate enlargement as above.  The prostate volume is 60.7 cc.

## 2022-06-22 ENCOUNTER — Ambulatory Visit: Payer: Medicare Other | Admitting: Cardiology

## 2022-06-27 ENCOUNTER — Encounter: Payer: Self-pay | Admitting: Cardiology

## 2022-06-27 ENCOUNTER — Ambulatory Visit: Payer: Medicare Other | Admitting: Cardiology

## 2022-06-27 VITALS — BP 139/73 | HR 63 | Resp 15 | Ht 70.0 in | Wt 192.6 lb

## 2022-06-27 DIAGNOSIS — I471 Supraventricular tachycardia, unspecified: Secondary | ICD-10-CM | POA: Insufficient documentation

## 2022-06-27 DIAGNOSIS — E782 Mixed hyperlipidemia: Secondary | ICD-10-CM

## 2022-06-27 DIAGNOSIS — I10 Essential (primary) hypertension: Secondary | ICD-10-CM

## 2022-06-27 MED ORDER — ROSUVASTATIN CALCIUM 20 MG PO TABS: 20.0000 mg | ORAL_TABLET | Freq: Every day | ORAL | 3 refills | Status: AC

## 2022-06-27 NOTE — Progress Notes (Signed)
Follow up visit  Subjective:   Gary Pierce, male    DOB: 08/12/1941, 81 y.o.   MRN: [id number]   HPI  Chief Complaint  Patient presents with   Shortness of Breath   Hypertension   Hyperlipidemia   Palpitations   Follow-up    4 week     81 y.o. African-American male  with hypertension, hyperlipidemia, CKD stage IV, former smoker,   Patient is doing well. Denies any complaints. Reviewed recent test results with the patient, details below.  Blood pressure elevated today. Improved on subsequent check.    Current Outpatient Medications:    albuterol (VENTOLIN HFA) 108 (90 Base) MCG/ACT inhaler, Inhale 2 puffs into the lungs every 4 (four) hours as needed for wheezing or shortness of breath., Disp: 18 g, Rfl: 0   alfuzosin (UROXATRAL) 10 MG 24 hr tablet, Take 10 mg by mouth daily as needed., Disp: , Rfl:    amLODipine (NORVASC) 10 MG tablet, Take 10 mg by mouth daily., Disp: , Rfl:    aspirin 81 MG chewable tablet, Chew 1 tablet (81 mg total) by mouth daily., Disp: 30 tablet, Rfl: 0   cetirizine (ZYRTEC) 5 MG tablet, Take 1 tablet (5 mg total) by mouth daily. (Patient taking differently: Take 5 mg by mouth daily as needed for allergies.), Disp: 30 tablet, Rfl: 0   colchicine 0.6 MG tablet, Take 0.6 mg by mouth 4 (four) times daily as needed (gout)., Disp: , Rfl:    labetalol (NORMODYNE) 100 MG tablet, TAKE 1 TABLET BY MOUTH TWICE A DAY, Disp: 180 tablet, Rfl: 1   loperamide (IMODIUM) 2 MG capsule, Take 1 capsule (2 mg total) by mouth daily as needed for diarrhea or loose stools. (Patient not taking: Reported on 05/02/2022), Disp: 10 capsule, Rfl: 0   olmesartan (BENICAR) 40 MG tablet, Take 40 mg by mouth daily. (Patient not taking: Reported on 05/02/2022), Disp: , Rfl:    ondansetron (ZOFRAN-ODT) 8 MG disintegrating tablet, Take 1 tablet (8 mg total) by mouth every 8 (eight) hours as needed for nausea or vomiting. (Patient not taking: Reported on 05/02/2022), Disp: 20 tablet, Rfl: 0    rosuvastatin (CRESTOR) 10 MG tablet, Take 1 tablet (10 mg total) by mouth daily., Disp: 30 tablet, Rfl: 0   silodosin (RAPAFLO) 8 MG CAPS capsule, Take 8 mg by mouth daily with breakfast., Disp: , Rfl:    zolpidem (AMBIEN) 10 MG tablet, Take 1 tablet (10 mg total) by mouth at bedtime., Disp: 30 tablet, Rfl: 1   Cardiovascular & other pertient studies:  Reviewed external labs and tests, independently interpreted  Mobile cardiac telemetry 13 days 05/02/2022 - 05/16/2022: Dominant rhythm: Sinus. HR 44-128 bpm. Avg HR 68 bpm, in sinus rhythm. 3 episodes of atrial tachycardia, fastest and longest at 121 bpm for 8 beats. 8% isolated SVE, <1 couplet/triplets. 0 episodes of VT. <1% isolated VE, couplet/triplets. Second degree type 1 AV block noted. No atrial fibrillation/atrial flutter/VT/high grade AV block, sinus pause >3sec noted. 14 patient triggered events, correlated with SVE/VE.    EKG 12/30/2021: Sinus rhythm Atrial premature complexes Prolonged PR interval Left axis deviation Abnormal R-wave progression, early transition  Exercise Tetrofosmin stress test 01/22/2022: Exercise nuclear stress test was performed using Bruce protocol. Patient reached 7 METS, and 107% of age predicted maximum heart rate. Exercise capacity was low. No chest pain reported. Resting hypertension 158/90 mmHg with normal hemodynamic response. Stress EKG revealed no ischemic changes. Decreased tracer uptake in inferior myocardium at rest and stress,  without any adjacent wall motion abnormality. Findings likely due to diaphragmatic attenuation.  Stress LVEF calculated 46%, but visually appears normal.  Stress LVEF calculated 46%, but visually appears normal. Low risk study.  Echocardiogram 12/31/2021: 1. Left ventricular ejection fraction, by estimation, is 60 to 65%. The  left ventricle has normal function. The left ventricle has no regional  wall motion abnormalities. Left ventricular diastolic parameters were   normal.   2. Right ventricular systolic function is low normal. The right  ventricular size is normal.   3. The mitral valve is normal in structure. No evidence of mitral valve  regurgitation. No evidence of mitral stenosis.   4. The aortic valve is tricuspid. There is mild calcification of the  aortic valve. Aortic valve regurgitation is not visualized. Mild aortic  valve stenosis. Aortic valve Vmax measures 1.76 m/s.     Recent labs: 03/28/2022: Glucose 85, BUN/Cr 34/2.2. EGFR 29. Na/K 139/4.9.  H/H 12/37. MCV 82. Platelets 212  01/01/2022: Glucose 93, BUN/Cr 25/1.92. EGFR 34. Na/K 140/4.7. Albumin 3.3, Protein 6.1. Rest of the CMP normal H/H 11/34. MCV 81. Platelets 235 HbA1C 5.9% Chol 223, TG 191, HDL 38, LDL 147 BNP 138    Review of Systems  Cardiovascular:  Positive for dyspnea on exertion. Negative for chest pain, leg swelling, palpitations and syncope.         Vitals:   06/27/22 1255  BP: (!) 176/78  Pulse: 68  Resp: 15  SpO2: 95%     Body mass index is 27.64 kg/m. Filed Weights   06/27/22 1255  Weight: 192 lb 9.6 oz (87.4 kg)     Objective:   Physical Exam Vitals and nursing note reviewed.  Constitutional:      General: He is not in acute distress. Neck:     Vascular: No JVD.  Cardiovascular:     Rate and Rhythm: Normal rate and regular rhythm.     Heart sounds: Normal heart sounds. No murmur heard. Pulmonary:     Effort: Pulmonary effort is normal.     Breath sounds: Normal breath sounds. No wheezing or rales.  Musculoskeletal:     Right lower leg: No edema.     Left lower leg: No edema.             Visit diagnoses:   ICD-10-CM   1. Essential hypertension  I10     2. Mixed hyperlipidemia  E78.2     3. PSVT (paroxysmal supraventricular tachycardia)  I47.10         Assessment & Recommendations:   81 y.o. African-American male  with hypertension, hyperlipidemia, CKD stage IV, former smoker, with exertional dyspnea    Exertional dyspnea: Improved  PSVT: Asymptomatic. Monitor for now.  Hypertension: Generally well controlled. No change today. Improved on subsequent check.   Mixed hyperlipidemia: LDL 147 on Crestor 10 mg daily. Increase Crestor to 20 mg daily.  F/u in 3 months     Nigel Mormon, MD Pager: [phone] Office: [phone]

## 2022-07-16 ENCOUNTER — Other Ambulatory Visit (HOSPITAL_BASED_OUTPATIENT_CLINIC_OR_DEPARTMENT_OTHER): Payer: Self-pay

## 2022-07-16 MED ORDER — COMIRNATY 30 MCG/0.3ML IM SUSY
PREFILLED_SYRINGE | INTRAMUSCULAR | 0 refills | Status: DC
  Filled 2022-07-16: qty 0.3, 1d supply, fill #0

## 2022-07-17 ENCOUNTER — Ambulatory Visit: Payer: Medicare Other | Admitting: Family Medicine

## 2022-07-19 ENCOUNTER — Ambulatory Visit (HOSPITAL_COMMUNITY): Payer: Medicare Other

## 2022-07-19 ENCOUNTER — Ambulatory Visit (INDEPENDENT_AMBULATORY_CARE_PROVIDER_SITE_OTHER): Payer: Medicare Other

## 2022-07-19 ENCOUNTER — Ambulatory Visit (HOSPITAL_COMMUNITY)
Admission: EM | Admit: 2022-07-19 | Discharge: 2022-07-19 | Disposition: A | Discharge status: Home / Self Care | Payer: Medicare Other | Attending: Internal Medicine | Admitting: Internal Medicine

## 2022-07-19 ENCOUNTER — Encounter (HOSPITAL_COMMUNITY): Payer: Self-pay

## 2022-07-19 DIAGNOSIS — J40 Bronchitis, not specified as acute or chronic: Secondary | ICD-10-CM | POA: Diagnosis not present

## 2022-07-19 DIAGNOSIS — J069 Acute upper respiratory infection, unspecified: Secondary | ICD-10-CM

## 2022-07-19 DIAGNOSIS — R0602 Shortness of breath: Secondary | ICD-10-CM

## 2022-07-19 DIAGNOSIS — J209 Acute bronchitis, unspecified: Secondary | ICD-10-CM | POA: Diagnosis not present

## 2022-07-19 DIAGNOSIS — R059 Cough, unspecified: Secondary | ICD-10-CM

## 2022-07-19 DIAGNOSIS — R051 Acute cough: Secondary | ICD-10-CM

## 2022-07-19 MED ORDER — ALBUTEROL SULFATE (2.5 MG/3ML) 0.083% IN NEBU
2.5000 mg | INHALATION_SOLUTION | Freq: Once | RESPIRATORY_TRACT | Status: AC
  Administered 2022-07-19: 2.5 mg via RESPIRATORY_TRACT

## 2022-07-19 MED ORDER — GUAIFENESIN ER 600 MG PO TB12
600.0000 mg | ORAL_TABLET | Freq: Every day | ORAL | 0 refills | Status: AC | PRN
Start: 2022-07-19 — End: ?

## 2022-07-19 MED ORDER — PREDNISONE 20 MG PO TABS: 40.0000 mg | ORAL_TABLET | Freq: Every day | ORAL | 0 refills | Status: AC

## 2022-07-19 MED ORDER — BENZONATATE 100 MG PO CAPS: 100.0000 mg | ORAL_CAPSULE | Freq: Three times a day (TID) | ORAL | 0 refills | Status: AC

## 2022-07-19 MED ORDER — ALBUTEROL SULFATE HFA 108 (90 BASE) MCG/ACT IN AERS: 2.0000 | INHALATION_SPRAY | RESPIRATORY_TRACT | 0 refills | Status: AC | PRN

## 2022-07-19 MED ORDER — ALBUTEROL SULFATE (2.5 MG/3ML) 0.083% IN NEBU
INHALATION_SOLUTION | RESPIRATORY_TRACT | Status: AC
  Filled 2022-07-19: qty 3

## 2022-07-19 NOTE — ED Provider Notes (Addendum)
[place]    CSN: [id number] Arrival date & time: 07/19/22  1001      History   Chief Complaint Chief Complaint  Patient presents with   Cough    HPI Gary Pierce is a 81 y.o. male.   Patient presents urgent care for evaluation of cough, shortness of breath, nasal congestion, and chills for the last week. Cough is worse at nighttime and productive with yellow phlegm.  Denies chest pain, heart palpitations, nausea, vomiting, leg swelling, orthopnea, dizziness, and recent falls.  No headache, blurry vision, or bodyaches.  He has not taken his temperature at home due to lack of thermometer but has been experiencing chills.  He has been using over-the-counter medications to try to help with symptoms without relief.  History of chronic asthma.  He has been using his albuterol inhaler but states he ran out yesterday.  Albuterol inhaler was not helping very much with his shortness of breath or cough.  Denies recent antibiotic or steroid use.  History of high blood pressure as well, taking medications as prescribed.  He is vaccinated against COVID-19 and influenza.  No recent known sick contacts.  Former cigarette smoker, quit 9 years ago.  No other drug use.   Cough   Past Medical History:  Diagnosis Date   Hypercholesterolemia    Hypertension     Patient Active Problem List   Diagnosis Date Noted   PSVT (paroxysmal supraventricular tachycardia) 06/27/2022   Nonrheumatic aortic valve stenosis 05/02/2022   Palpitations 05/02/2022   Dyspnea on exertion 03/27/2022   CKD (chronic kidney disease) stage 4, GFR 15-29 ml/min (HCC) 12/31/2021   Chest pain 12/30/2021   Exertional dyspnea 08/17/2015   Shortness of breath 08/15/2015    Class: Acute   History of gout 07/19/2014   Essential hypertension 07/19/2014   BPH (benign prostatic hyperplasia) 07/19/2014   Mixed hyperlipidemia 07/19/2014   Asthma, chronic 07/19/2014   Erectile dysfunction 07/19/2014   Pain in limb  10/02/2012   Varicose veins of lower extremities with other complications 79/39/0300    Past Surgical History:  Procedure Laterality Date   COLONOSCOPY     2015  due in 2017       Home Medications    Prior to Admission medications   Medication Sig Start Date End Date Taking? Authorizing Provider  benzonatate (TESSALON) 100 MG capsule Take 1 capsule (100 mg total) by mouth every 8 (eight) hours. 07/19/22  Yes Talbot Grumbling, FNP  guaiFENesin (MUCINEX) 600 MG 12 hr tablet Take 1 tablet (600 mg total) by mouth daily as needed. 07/19/22  Yes Talbot Grumbling, FNP  predniSONE (DELTASONE) 20 MG tablet Take 2 tablets (40 mg total) by mouth daily for 5 days. 07/19/22 07/24/22 Yes Talbot Grumbling, FNP  albuterol (VENTOLIN HFA) 108 (90 Base) MCG/ACT inhaler Inhale 2 puffs into the lungs every 4 (four) hours as needed for wheezing or shortness of breath. 07/19/22   Talbot Grumbling, FNP  alfuzosin (UROXATRAL) 10 MG 24 hr tablet Take 10 mg by mouth daily as needed. 10/07/21   [provider]  amLODipine (NORVASC) 10 MG tablet Take 10 mg by mouth daily. 10/03/20   [provider]  aspirin 81 MG chewable tablet Chew 1 tablet (81 mg total) by mouth daily. 01/01/22   Thurnell Lose, MD  cetirizine (ZYRTEC) 5 MG tablet Take 1 tablet (5 mg total) by mouth daily. Patient taking differently: Take 5 mg by mouth daily as needed for allergies.  11/03/20   Jaynee Eagles, PA-C  colchicine 0.6 MG tablet Take 0.6 mg by mouth 4 (four) times daily as needed (gout). 10/23/21   [provider]  COVID-19 mRNA vaccine [phone] (COMIRNATY) syringe Inject into the muscle. 07/16/22   Carlyle Basques, MD  labetalol (NORMODYNE) 100 MG tablet TAKE 1 TABLET BY MOUTH TWICE A DAY 05/04/22   Patwardhan, Manish J, MD  loperamide (IMODIUM) 2 MG capsule Take 1 capsule (2 mg total) by mouth daily as needed for diarrhea or loose stools. 08/16/20   Jaynee Eagles, PA-C  olmesartan (BENICAR) 40 MG  tablet Take 40 mg by mouth daily. 10/10/21   [provider]  ondansetron (ZOFRAN-ODT) 8 MG disintegrating tablet Take 1 tablet (8 mg total) by mouth every 8 (eight) hours as needed for nausea or vomiting. 08/16/20   Jaynee Eagles, PA-C  rosuvastatin (CRESTOR) 20 MG tablet Take 1 tablet (20 mg total) by mouth daily. 06/27/22   Patwardhan, Reynold Bowen, MD  silodosin (RAPAFLO) 8 MG CAPS capsule Take 8 mg by mouth daily with breakfast.    [provider]  zolpidem (AMBIEN) 10 MG tablet Take 1 tablet (10 mg total) by mouth at bedtime. 05/11/22   Susy Frizzle, MD    Family History Family History  Problem Relation Age of Onset   Cancer Mother    Heart disease Father    Hyperlipidemia Father    Hypertension Father    Heart attack Father    Liver disease Brother     Social History Social History   Tobacco Use   Smoking status: Former    Years: 25.00    Types: Cigarettes    Quit date: 09/18/2012    Years since quitting: 9.8   Smokeless tobacco: Never  Vaping Use   Vaping Use: Never used  Substance Use Topics   Alcohol use: No    Alcohol/week: 0.0 standard drinks of alcohol   Drug use: No     Allergies   Cephalexin   Review of Systems Review of Systems  Respiratory:  Positive for cough.   Per HPI   Physical Exam Triage Vital Signs ED Triage Vitals [07/19/22 1256]  Enc Vitals Group     BP (!) 176/78     Pulse Rate 77     Resp 18     Temp 97.6 F (36.4 C)     Temp Source Oral     SpO2 96 %     Weight      Height      Head Circumference      Peak Flow      Pain Score 0     Pain Loc      Pain Edu?      Excl. in [place]?    No data found.  Updated Vital Signs BP (!) 176/78 (BP Location: Left Arm)   Pulse 77   Temp 97.6 F (36.4 C) (Oral)   Resp 18   SpO2 96%   Visual Acuity Right Eye Distance:   Left Eye Distance:   Bilateral Distance:    Right Eye Near:   Left Eye Near:    Bilateral Near:     Physical Exam Vitals and nursing note  reviewed.  Constitutional:      Appearance: He is ill-appearing. He is not toxic-appearing.  HENT:     Head: Normocephalic and atraumatic.     Right Ear: Hearing, tympanic membrane, ear canal and external ear normal.     Left Ear: Hearing,  tympanic membrane, ear canal and external ear normal.     Nose: Congestion present.     Mouth/Throat:     Lips: Pink.     Mouth: Mucous membranes are moist.     Pharynx: No posterior oropharyngeal erythema.  Eyes:     General: Lids are normal. Vision grossly intact. Gaze aligned appropriately.     Extraocular Movements: Extraocular movements intact.     Conjunctiva/sclera: Conjunctivae normal.  Cardiovascular:     Rate and Rhythm: Normal rate and regular rhythm.     Heart sounds: Normal heart sounds, S1 normal and S2 normal.  Pulmonary:     Effort: Pulmonary effort is normal. No respiratory distress.     Breath sounds: Normal air entry. Wheezing present. No rhonchi or rales.     Comments: Diffuse inspiratory and expiratory wheezing heard to all lung fields, no respiratory distress.  Chest:     Chest wall: No tenderness.  Abdominal:     General: Bowel sounds are normal.     Palpations: Abdomen is soft.     Tenderness: There is no abdominal tenderness. There is no right CVA tenderness, left CVA tenderness or guarding.  Musculoskeletal:     Cervical back: Neck supple.  Lymphadenopathy:     Cervical: No cervical adenopathy.  Skin:    General: Skin is warm and dry.     Capillary Refill: Capillary refill takes less than 2 seconds.     Findings: No rash.  Neurological:     General: No focal deficit present.     Mental Status: He is alert and oriented to person, place, and time. Mental status is at baseline.     Cranial Nerves: No dysarthria or facial asymmetry.  Psychiatric:        Mood and Affect: Mood normal.        Speech: Speech normal.        Behavior: Behavior normal.        Thought Content: Thought content normal.        Judgment:  Judgment normal.      UC Treatments / Results  Labs (all labs ordered are listed, but only abnormal results are displayed) Labs Reviewed - No data to display  EKG   Radiology DG Chest 1 View  Result Date: 07/19/2022 CLINICAL DATA:  Shortness of breath. EXAM: CHEST  1 VIEW COMPARISON:  Same day. FINDINGS: Lateral radiograph only of the chest was obtained. No definite abnormality is seen. IMPRESSION: No active disease seen in lateral radiograph only of the chest. Electronically Signed   By: Marijo Conception M.D.   On: 07/19/2022 13:57   DG Chest 2 View  Result Date: 07/19/2022 CLINICAL DATA:  Shortness of breath and cough for 1 week. EXAM: CHEST - 2 VIEW COMPARISON:  03/27/2022 FINDINGS: The cardiac silhouette, mediastinal and hilar contours are within normal limits and stable. Mild tortuosity and calcification of the thoracic aorta. Mild eventration of the right hemidiaphragm with some overlying vascular crowding and streaky atelectasis but no infiltrates, edema or effusions. No pulmonary lesions. The bony thorax is intact. IMPRESSION: Streaky right basilar atelectasis. No infiltrates or effusions. Electronically Signed   By: Marijo Sanes M.D.   On: 07/19/2022 13:42    Procedures Procedures (including critical care time)  Medications Ordered in UC Medications  albuterol (PROVENTIL) (2.5 MG/3ML) 0.083% nebulizer solution 2.5 mg (2.5 mg Nebulization Given 07/19/22 1405)    Initial Impression / Assessment and Plan / UC Course  I have reviewed the  triage vital signs and the nursing notes.  Pertinent labs & imaging results that were available during my care of the patient were reviewed by me and considered in my medical decision making (see chart for details).   1.  Acute bronchitis, viral URI with cough Presentation is consistent with acute bronchitis due to viral upper respiratory infection.  Chest x-ray is negative for acute cardiopulmonary finding, this is reassuring.  We will manage  this with 40 mg prednisone burst once daily for the next 5 days.  He may take his first dose when he gets home from urgent care after picking up medication at the pharmacy with food.  Advised to avoid NSAIDs when taking prednisone (and in general due to chronic kidney disease).  Deferred viral testing today as this would not change her treatment plan and patient is out of the window for antiviral therapy should he be infected with COVID-19.  May take renal dosing of guaifenesin 600 mg once daily as needed for nasal congestion but must not use this long-term as guaifenesin is renally excreted.  May use Tessalon Perles every 8 hours as needed for cough.  He may use Tylenol if needed for any aches and pains that he may have.  Albuterol inhaler refill sent to pharmacy to be used every 4-6 hours as needed for cough, shortness of breath, and wheeze.  Strict ER return precautions discussed.  Patient is to take all of his normal medications as prescribed.  He is nontoxic in appearance with hemodynamically stable vital signs, therefore we will defer ED referral at this time.   Discussed physical exam and available lab work findings in clinic with patient.  Counseled patient regarding appropriate use of medications and potential side effects for all medications recommended or prescribed today. Discussed red flag signs and symptoms of worsening condition,when to call the PCP office, return to urgent care, and when to seek higher level of care in the emergency department. Patient verbalizes understanding and agreement with plan. All questions answered. Patient discharged in stable condition.    Final Clinical Impressions(s) / UC Diagnoses   Final diagnoses:  Bronchitis  Acute cough  Viral URI with cough     Discharge Instructions      Your chest x-ray is negative for pneumonia, collapsed lung, and other abnormalities.  Take Tessalon Perles every 8 hours as needed for cough.  Take guaifenesin 600 mg once  daily as needed for nasal congestion.  Do not use this long-term.   Take prednisone 40 mg once daily for the next 5 days by mouth with breakfast to help reduce inflammation in the lungs and help with cough/wheezing.  You may continue using your albuterol inhaler every 4-6 hours as needed for cough, shortness of breath, and wheeze.  If you develop any new or worsening symptoms or do not improve in the next 2 to 3 days, please return.  If your symptoms are severe, please go to the emergency room.  Follow-up with your primary care provider for further evaluation and management of your symptoms as well as ongoing wellness visits.  I hope you feel better!      ED Prescriptions     Medication Sig Dispense Auth. Provider   predniSONE (DELTASONE) 20 MG tablet Take 2 tablets (40 mg total) by mouth daily for 5 days. 10 tablet Talbot Grumbling, FNP   guaiFENesin (MUCINEX) 600 MG 12 hr tablet Take 1 tablet (600 mg total) by mouth daily as needed. 30 tablet Joella Prince  M, FNP   benzonatate (TESSALON) 100 MG capsule Take 1 capsule (100 mg total) by mouth every 8 (eight) hours. 21 capsule Joella Prince M, FNP   albuterol (VENTOLIN HFA) 108 (90 Base) MCG/ACT inhaler Inhale 2 puffs into the lungs every 4 (four) hours as needed for wheezing or shortness of breath. 18 g Talbot Grumbling, FNP      PDMP not reviewed this encounter.   Talbot Grumbling, FNP 07/19/22 [place], [place], FNP 08/12/22 [phone]

## 2022-07-19 NOTE — Discharge Instructions (Addendum)
Your chest x-ray is negative for pneumonia, collapsed lung, and other abnormalities.  Take Tessalon Perles every 8 hours as needed for cough.  Take guaifenesin 600 mg once daily as needed for nasal congestion.  Do not use this long-term.   Take prednisone 40 mg once daily for the next 5 days by mouth with breakfast to help reduce inflammation in the lungs and help with cough/wheezing.  You may continue using your albuterol inhaler every 4-6 hours as needed for cough, shortness of breath, and wheeze.  If you develop any new or worsening symptoms or do not improve in the next 2 to 3 days, please return.  If your symptoms are severe, please go to the emergency room.  Follow-up with your primary care provider for further evaluation and management of your symptoms as well as ongoing wellness visits.  I hope you feel better!

## 2022-07-19 NOTE — ED Triage Notes (Signed)
Pt c/o productive cough with yellow sputum and SOB x1wk. States taking OTC meds with no relief.

## 2022-07-20 ENCOUNTER — Ambulatory Visit (INDEPENDENT_AMBULATORY_CARE_PROVIDER_SITE_OTHER): Payer: Medicare Other | Admitting: Family Medicine

## 2022-07-20 VITALS — BP 130/76 | HR 72 | Ht 70.0 in | Wt 193.2 lb

## 2022-07-20 DIAGNOSIS — R7303 Prediabetes: Secondary | ICD-10-CM

## 2022-07-20 DIAGNOSIS — N184 Chronic kidney disease, stage 4 (severe): Secondary | ICD-10-CM

## 2022-07-20 NOTE — Progress Notes (Signed)
Subjective:    Patient ID: Gary Pierce, male    DOB: [birth year], 81 y.o.   MRN: [id number]  HPI Patient is a very pleasant 81 year old African-American male who presents today for follow-up.  He was seen in urgent care yesterday.  He states that he had a cold for more than a week and was having coughing and shortness of breath.  Chest x-ray in the urgent care was negative.  COVID test was negative.  Patient was treated with albuterol and prednisone.  He states that his breathing is much better today.  Today on examination, his lungs are clear to auscultation bilaterally he has not had to use the albuterol today.  He denies any chest pain or pleurisy or hemoptysis or fever or chills.  He has his prediabetes with stable coronary disease.  He is seeing nephrologist.  He saw them 1 month ago and will see them again in 6 months.  He is due for lab work to monitor his kidney function as well as his A1c.  His last A1c was less than 6 Past Medical History:  Diagnosis Date   Hypercholesterolemia    Hypertension    Past Surgical History:  Procedure Laterality Date   COLONOSCOPY     2015  due in 2017   Current Outpatient Medications on File Prior to Visit  Medication Sig Dispense Refill   albuterol (VENTOLIN HFA) 108 (90 Base) MCG/ACT inhaler Inhale 2 puffs into the lungs every 4 (four) hours as needed for wheezing or shortness of breath. 18 g 0   alfuzosin (UROXATRAL) 10 MG 24 hr tablet Take 10 mg by mouth daily as needed.     amLODipine (NORVASC) 10 MG tablet Take 10 mg by mouth daily.     aspirin 81 MG chewable tablet Chew 1 tablet (81 mg total) by mouth daily. 30 tablet 0   benzonatate (TESSALON) 100 MG capsule Take 1 capsule (100 mg total) by mouth every 8 (eight) hours. 21 capsule 0   cetirizine (ZYRTEC) 5 MG tablet Take 1 tablet (5 mg total) by mouth daily. (Patient taking differently: Take 5 mg by mouth daily as needed for allergies.) 30 tablet 0   colchicine 0.6 MG tablet Take 0.6 mg by mouth  4 (four) times daily as needed (gout).     guaiFENesin (MUCINEX) 600 MG 12 hr tablet Take 1 tablet (600 mg total) by mouth daily as needed. 30 tablet 0   labetalol (NORMODYNE) 100 MG tablet TAKE 1 TABLET BY MOUTH TWICE A DAY 180 tablet 1   loperamide (IMODIUM) 2 MG capsule Take 1 capsule (2 mg total) by mouth daily as needed for diarrhea or loose stools. 10 capsule 0   olmesartan (BENICAR) 40 MG tablet Take 40 mg by mouth daily.     ondansetron (ZOFRAN-ODT) 8 MG disintegrating tablet Take 1 tablet (8 mg total) by mouth every 8 (eight) hours as needed for nausea or vomiting. 20 tablet 0   predniSONE (DELTASONE) 20 MG tablet Take 2 tablets (40 mg total) by mouth daily for 5 days. 10 tablet 0   rosuvastatin (CRESTOR) 20 MG tablet Take 1 tablet (20 mg total) by mouth daily. 90 tablet 3   silodosin (RAPAFLO) 8 MG CAPS capsule Take 8 mg by mouth daily with breakfast.     zolpidem (AMBIEN) 10 MG tablet Take 1 tablet (10 mg total) by mouth at bedtime. 30 tablet 1   No current facility-administered medications on file prior to visit.   Allergies  Allergen  Reactions   Cephalexin Diarrhea   Social History   Socioeconomic History   Marital status: Legally Separated    Spouse name: Not on file   Number of children: 3   Years of education: Not on file   Highest education level: Not on file  Occupational History   Not on file  Tobacco Use   Smoking status: Former    Years: 25.00    Types: Cigarettes    Quit date: 09/18/2012    Years since quitting: 9.8   Smokeless tobacco: Never  Vaping Use   Vaping Use: Never used  Substance and Sexual Activity   Alcohol use: No    Alcohol/week: 0.0 standard drinks of alcohol   Drug use: No   Sexual activity: Not on file  Other Topics Concern   Not on file  Social History Narrative   Not on file   Social Determinants of Health   Financial Resource Strain: Not on file  Food Insecurity: Not on file  Transportation Needs: Not on file  Physical  Activity: Not on file  Stress: Not on file  Social Connections: Not on file  Intimate Partner Violence: Not on file   Family History  Problem Relation Age of Onset   Cancer Mother    Heart disease Father    Hyperlipidemia Father    Hypertension Father    Heart attack Father    Liver disease Brother      Review of Systems  All other systems reviewed and are negative.      Objective:   Physical Exam Vitals reviewed.  Constitutional:      General: He is not in acute distress.    Appearance: Normal appearance. He is normal weight. He is not ill-appearing, toxic-appearing or diaphoretic.  Neck:     Vascular: No carotid bruit.  Cardiovascular:     Rate and Rhythm: Normal rate and regular rhythm.     Pulses: Normal pulses.     Heart sounds: Murmur heard.     No friction rub. No gallop.  Pulmonary:     Effort: Pulmonary effort is normal. No respiratory distress.     Breath sounds: Normal breath sounds. No stridor. No wheezing or rales.  Abdominal:     General: Abdomen is flat. Bowel sounds are normal. There is no distension.     Palpations: Abdomen is soft.     Tenderness: There is no abdominal tenderness. There is no guarding.  Musculoskeletal:     Cervical back: Rigidity present.     Right lower leg: No edema.     Left lower leg: No edema.  Lymphadenopathy:     Cervical: No cervical adenopathy.  Skin:    Findings: No erythema.  Neurological:     Mental Status: He is alert.           Assessment & Plan:   CKD (chronic kidney disease) stage 4, GFR 15-29 ml/min (HCC) - Plan: Hemoglobin A1c, BASIC METABOLIC PANEL WITH GFR, Protein / Creatinine Ratio, Urine  Prediabetes - Plan: Hemoglobin A1c, BASIC METABOLIC PANEL WITH GFR, Protein / Creatinine Ratio, Urine Patient's lungs are clear to auscultation bilaterally.  I recommend that he continue the prednisone and complete the course.  His blood pressure is excellent.  I will check a CMP to monitor his renal function  and potassium, and A1c to monitor his diabetes, and a urine protein to creatinine ratio to monitor for any significant proteinuria that would benefit from medication such as [place]

## 2022-07-21 LAB — BASIC METABOLIC PANEL WITH GFR
BUN/Creatinine Ratio: 14 (calc) (ref 6–22)
BUN: 26 mg/dL — ABNORMAL HIGH (ref 7–25)
CO2: 22 mmol/L (ref 20–32)
Calcium: 9.3 mg/dL (ref 8.6–10.3)
Chloride: 107 mmol/L (ref 98–110)
Creat: 1.82 mg/dL — ABNORMAL HIGH (ref 0.70–1.22)
Glucose, Bld: 83 mg/dL (ref 65–99)
Potassium: 5.4 mmol/L — ABNORMAL HIGH (ref 3.5–5.3)
Sodium: 138 mmol/L (ref 135–146)
eGFR: 36 mL/min/{1.73_m2} — ABNORMAL LOW (ref >=60)

## 2022-07-21 LAB — PROTEIN / CREATININE RATIO, URINE
Creatinine, Urine: 171 mg/dL (ref 20–320)
Protein/Creat Ratio: 649 mg/g creat — ABNORMAL HIGH (ref 25–148)
Protein/Creatinine Ratio: 0.649 mg/mg creat — ABNORMAL HIGH (ref 0.025–0.148)
Total Protein, Urine: 111 mg/dL — ABNORMAL HIGH (ref 5–25)

## 2022-07-21 LAB — HEMOGLOBIN A1C
Hgb A1c MFr Bld: 5.7 % of total Hgb — ABNORMAL HIGH (ref <5.7)
Mean Plasma Glucose: 117 mg/dL
eAG (mmol/L): 6.5 mmol/L

## 2022-08-16 ENCOUNTER — Ambulatory Visit (INDEPENDENT_AMBULATORY_CARE_PROVIDER_SITE_OTHER): Payer: Medicare Other | Admitting: Family Medicine

## 2022-08-16 DIAGNOSIS — N184 Chronic kidney disease, stage 4 (severe): Secondary | ICD-10-CM

## 2022-08-17 LAB — BASIC METABOLIC PANEL
BUN/Creatinine Ratio: 19 (calc) (ref 6–22)
BUN: 41 mg/dL — ABNORMAL HIGH (ref 7–25)
CO2: 21 mmol/L (ref 20–32)
Calcium: 9.3 mg/dL (ref 8.6–10.3)
Chloride: 109 mmol/L (ref 98–110)
Creat: 2.21 mg/dL — ABNORMAL HIGH (ref 0.70–1.22)
Glucose, Bld: 84 mg/dL (ref 65–99)
Potassium: 5.4 mmol/L — ABNORMAL HIGH (ref 3.5–5.3)
Sodium: 140 mmol/L (ref 135–146)

## 2022-08-30 ENCOUNTER — Ambulatory Visit (INDEPENDENT_AMBULATORY_CARE_PROVIDER_SITE_OTHER): Payer: 59 | Admitting: Family Medicine

## 2022-08-30 ENCOUNTER — Encounter: Payer: Self-pay | Admitting: Family Medicine

## 2022-08-30 VITALS — BP 164/85 | HR 61 | Wt 186.0 lb

## 2022-08-30 DIAGNOSIS — R109 Unspecified abdominal pain: Secondary | ICD-10-CM

## 2022-08-30 NOTE — Progress Notes (Signed)
Subjective:    Patient ID: Gary Pierce, male    DOB: September 12, 1940, 82 y.o.   MRN: [id number]  Eye Problem    Patient is a very pleasant 82 year old African-American male who presents today for follow-up.  He has a history of chronic kidney disease.  At his last visit I started [place] due to proteinuria and chronic kidney disease to try to provide renal protection.  Since that time he has developed flank pain in both his right and his left flank.  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  He denies any hematuria or dysuria or urgency or frequency.  He denies any nausea or vomiting.  The pain is worse with movement.  However I am unable to trigger the pain by palpation in the lumbar or thoracic paraspinal muscles.  There is no tenderness to palpation in his ribs or along the spinous processes of the thoracic or lumbar spine. Past Medical History:  Diagnosis Date   Hypercholesterolemia    Hypertension    Past Surgical History:  Procedure Laterality Date   COLONOSCOPY     2015  due in 2017   Current Outpatient Medications on File Prior to Visit  Medication Sig Dispense Refill   albuterol (VENTOLIN HFA) 108 (90 Base) MCG/ACT inhaler Inhale 2 puffs into the lungs every 4 (four) hours as needed for wheezing or shortness of breath. 18 g 0   alfuzosin (UROXATRAL) 10 MG 24 hr tablet Take 10 mg by mouth daily as needed.     amLODipine (NORVASC) 10 MG tablet Take 10 mg by mouth daily.     aspirin 81 MG chewable tablet Chew 1 tablet (81 mg total) by mouth daily. 30 tablet 0   benzonatate (TESSALON) 100 MG capsule Take 1 capsule (100 mg total) by mouth every 8 (eight) hours. 21 capsule 0   cetirizine (ZYRTEC) 5 MG tablet Take 1 tablet (5 mg total) by mouth daily. (Patient taking differently: Take 5 mg by mouth daily as needed for allergies.) 30 tablet 0   colchicine 0.6 MG tablet Take 0.6 mg by mouth 4 (four) times daily as needed (gout).     guaiFENesin (MUCINEX) 600  MG 12 hr tablet Take 1 tablet (600 mg total) by mouth daily as needed. 30 tablet 0   labetalol (NORMODYNE) 100 MG tablet TAKE 1 TABLET BY MOUTH TWICE A DAY 180 tablet 1   loperamide (IMODIUM) 2 MG capsule Take 1 capsule (2 mg total) by mouth daily as needed for diarrhea or loose stools. 10 capsule 0   olmesartan (BENICAR) 40 MG tablet Take 40 mg by mouth daily.     ondansetron (ZOFRAN-ODT) 8 MG disintegrating tablet Take 1 tablet (8 mg total) by mouth every 8 (eight) hours as needed for nausea or vomiting. 20 tablet 0   rosuvastatin (CRESTOR) 20 MG tablet Take 1 tablet (20 mg total) by mouth daily. 90 tablet 3   silodosin (RAPAFLO) 8 MG CAPS capsule Take 8 mg by mouth daily with breakfast.     zolpidem (AMBIEN) 10 MG tablet Take 1 tablet (10 mg total) by mouth at bedtime. 30 tablet 1   No current facility-administered medications on file prior to visit.   Allergies  Allergen Reactions   Cephalexin Diarrhea   Social History   Socioeconomic History   Marital status: Legally Separated    Spouse name: Not on file   Number of children: 3   Years of education: Not on file  Highest education level: Not on file  Occupational History   Not on file  Tobacco Use   Smoking status: Former    Years: 25.00    Types: Cigarettes    Quit date: 09/18/2012    Years since quitting: 9.9   Smokeless tobacco: Never  Vaping Use   Vaping Use: Never used  Substance and Sexual Activity   Alcohol use: No    Alcohol/week: 0.0 standard drinks of alcohol   Drug use: No   Sexual activity: Not on file  Other Topics Concern   Not on file  Social History Narrative   Not on file   Social Determinants of Health   Financial Resource Strain: Not on file  Food Insecurity: Not on file  Transportation Needs: Not on file  Physical Activity: Not on file  Stress: Not on file  Social Connections: Not on file  Intimate Partner Violence: Not on file   Family History  Problem Relation Age of Onset   Cancer  Mother    Heart disease Father    Hyperlipidemia Father    Hypertension Father    Heart attack Father    Liver disease Brother      Review of Systems  All other systems reviewed and are negative.      Objective:   Physical Exam Vitals reviewed.  Constitutional:      General: He is not in acute distress.    Appearance: Normal appearance. He is normal weight. He is not ill-appearing, toxic-appearing or diaphoretic.  Neck:     Vascular: No carotid bruit.  Cardiovascular:     Rate and Rhythm: Normal rate and regular rhythm.     Pulses: Normal pulses.     Heart sounds: Murmur heard.     No friction rub. No gallop.  Pulmonary:     Effort: Pulmonary effort is normal. No respiratory distress.     Breath sounds: Normal breath sounds. No stridor. No wheezing or rales.    Chest:     Chest wall: No deformity, swelling or tenderness.  Abdominal:     General: Abdomen is flat. Bowel sounds are normal. There is no distension.     Palpations: Abdomen is soft.     Tenderness: There is no abdominal tenderness. There is no guarding.  Musculoskeletal:     Cervical back: Rigidity present.     Thoracic back: No spasms, tenderness or bony tenderness.     Lumbar back: No spasms, tenderness or bony tenderness.     Right lower leg: No edema.     Left lower leg: No edema.  Lymphadenopathy:     Cervical: No cervical adenopathy.  Skin:    Findings: No erythema.  Neurological:     Mental Status: He is alert.           Assessment & Plan:   Flank pain - Plan: Urinalysis, Routine w reflex microscopic, COMPLETE METABOLIC PANEL WITH GFR, CBC with Differential/Platelet, US Renal I do not believe that the [place] is the cause of his flank pain.  He does not see a tenderness.  Begin obtaining renal ultrasound to evaluate for any hydronephrosis or obstructive nephropathy.  Check a CMP to monitor renal function for any deterioration.  Check a CBC to look for leukocytosis.  Check a urinalysis to  look for any hematuria or evidence of a urinary tract infection.  Temporarily hold [place].  If the above workup is negative, consider a workup for musculoskeletal pain.  Blood pressure today is elevated  however he is checking his blood pressure daily at home on a blood pressure cuff that has been verified to be accurate and his blood pressure is less than 140/90

## 2022-08-31 LAB — CBC WITH DIFFERENTIAL/PLATELET
Absolute Monocytes: 489 cells/uL (ref 200–950)
Basophils Absolute: 42 cells/uL (ref 0–200)
Basophils Relative: 0.8 %
Eosinophils Absolute: 161 cells/uL (ref 15–500)
Eosinophils Relative: 3.1 %
HCT: 34.5 % — ABNORMAL LOW (ref 38.5–50.0)
Hemoglobin: 11.9 g/dL — ABNORMAL LOW (ref 13.2–17.1)
Lymphs Abs: 1955 cells/uL (ref 850–3900)
MCH: 28.2 pg (ref 27.0–33.0)
MCHC: 34.5 g/dL (ref 32.0–36.0)
MCV: 81.8 fL (ref 80.0–100.0)
MPV: 11.3 fL (ref 7.5–12.5)
Monocytes Relative: 9.4 %
Neutro Abs: 2553 cells/uL (ref 1500–7800)
Neutrophils Relative %: 49.1 %
Platelets: 215 10*3/uL (ref 140–400)
RBC: 4.22 10*6/uL (ref 4.20–5.80)
RDW: 14.1 % (ref 11.0–15.0)
Total Lymphocyte: 37.6 %
WBC: 5.2 10*3/uL (ref 3.8–10.8)

## 2022-08-31 LAB — URINALYSIS, ROUTINE W REFLEX MICROSCOPIC
Bacteria, UA: NONE SEEN /HPF
Bilirubin Urine: NEGATIVE
Hgb urine dipstick: NEGATIVE
Hyaline Cast: NONE SEEN /LPF
Ketones, ur: NEGATIVE
Leukocytes,Ua: NEGATIVE
Nitrite: NEGATIVE
RBC / HPF: NONE SEEN /HPF (ref 0–2)
Specific Gravity, Urine: 1.015 (ref 1.001–1.035)
Squamous Epithelial / HPF: NONE SEEN /HPF (ref <=5)
WBC, UA: NONE SEEN /HPF (ref 0–5)
pH: 5.5 (ref 5.0–8.0)

## 2022-08-31 LAB — MICROSCOPIC MESSAGE

## 2022-08-31 LAB — COMPLETE METABOLIC PANEL WITH GFR
AG Ratio: 1.6 (calc) (ref 1.0–2.5)
ALT: 10 U/L (ref 9–46)
AST: 17 U/L (ref 10–35)
Albumin: 3.9 g/dL (ref 3.6–5.1)
Alkaline phosphatase (APISO): 68 U/L (ref 35–144)
BUN/Creatinine Ratio: 16 (calc) (ref 6–22)
BUN: 31 mg/dL — ABNORMAL HIGH (ref 7–25)
CO2: 20 mmol/L (ref 20–32)
Calcium: 9 mg/dL (ref 8.6–10.3)
Chloride: 110 mmol/L (ref 98–110)
Creat: 2 mg/dL — ABNORMAL HIGH (ref 0.70–1.22)
Globulin: 2.5 g/dL (calc) (ref 1.9–3.7)
Glucose, Bld: 77 mg/dL (ref 65–99)
Potassium: 5.2 mmol/L (ref 3.5–5.3)
Sodium: 141 mmol/L (ref 135–146)
Total Bilirubin: 0.7 mg/dL (ref 0.2–1.2)
Total Protein: 6.4 g/dL (ref 6.1–8.1)
eGFR: 33 mL/min/{1.73_m2} — ABNORMAL LOW (ref >=60)

## 2022-09-11 ENCOUNTER — Ambulatory Visit (INDEPENDENT_AMBULATORY_CARE_PROVIDER_SITE_OTHER): Payer: 59

## 2022-09-11 VITALS — Ht 70.0 in | Wt 186.0 lb

## 2022-09-11 DIAGNOSIS — Z0000 Encounter for general adult medical examination without abnormal findings: Secondary | ICD-10-CM

## 2022-09-11 NOTE — Progress Notes (Signed)
Subjective:   Gary Pierce is a 82 y.o. male who presents for an Initial Medicare Annual Wellness Visit.  I connected with  Gary Pierce on 09/11/22 by a audio enabled telemedicine application and verified that I am speaking with the correct person using two identifiers.  Patient Location: Home  Provider Location: Office/Clinic  I discussed the limitations of evaluation and management by telemedicine. The patient expressed understanding and agreed to proceed.  Review of Systems     Cardiac Risk Factors include: advanced age (>28men, >58 women);male gender;hypertension     Objective:    Today's Vitals   09/11/22 1334  Weight: 186 lb (84.4 kg)  Height: 5\' 10"  (1.778 m)   Body mass index is 26.69 kg/m.     09/11/2022    1:44 PM 03/27/2022    9:31 AM 12/30/2021    6:48 PM 07/01/2017   11:26 AM 08/11/2015    6:03 PM 04/15/2014    4:25 PM 04/15/2014    3:00 PM  Advanced Directives  Does Patient Have a Medical Advance Directive? No No Yes Yes No No No  Type of 06/15/2014 of [place];Living will     Does patient want to make changes to medical advance directive?   No - Patient declined      Copy of Healthcare Power of Attorney in Chart?   No - copy requested, Physician notified      Would patient like information on creating a medical advance directive? Yes (MAU/Ambulatory/Procedural Areas - Information given)   No - Patient declined  Yes - Educational materials given     Current Medications (verified) Outpatient Encounter Medications as of 09/11/2022  Medication Sig   albuterol (VENTOLIN HFA) 108 (90 Base) MCG/ACT inhaler Inhale 2 puffs into the lungs every 4 (four) hours as needed for wheezing or shortness of breath.   alfuzosin (UROXATRAL) 10 MG 24 hr tablet Take 10 mg by mouth daily as needed.   amLODipine (NORVASC) 10 MG tablet Take 10 mg by mouth daily.   aspirin 81 MG chewable tablet Chew 1 tablet (81 mg total) by mouth  daily.   benzonatate (TESSALON) 100 MG capsule Take 1 capsule (100 mg total) by mouth every 8 (eight) hours.   cetirizine (ZYRTEC) 5 MG tablet Take 1 tablet (5 mg total) by mouth daily. (Patient taking differently: Take 5 mg by mouth daily as needed for allergies.)   colchicine 0.6 MG tablet Take 0.6 mg by mouth 4 (four) times daily as needed (gout).   guaiFENesin (MUCINEX) 600 MG 12 hr tablet Take 1 tablet (600 mg total) by mouth daily as needed.   labetalol (NORMODYNE) 100 MG tablet TAKE 1 TABLET BY MOUTH TWICE A DAY   loperamide (IMODIUM) 2 MG capsule Take 1 capsule (2 mg total) by mouth daily as needed for diarrhea or loose stools.   olmesartan (BENICAR) 40 MG tablet Take 40 mg by mouth daily.   ondansetron (ZOFRAN-ODT) 8 MG disintegrating tablet Take 1 tablet (8 mg total) by mouth every 8 (eight) hours as needed for nausea or vomiting.   rosuvastatin (CRESTOR) 20 MG tablet Take 1 tablet (20 mg total) by mouth daily.   silodosin (RAPAFLO) 8 MG CAPS capsule Take 8 mg by mouth daily with breakfast.   zolpidem (AMBIEN) 10 MG tablet Take 1 tablet (10 mg total) by mouth at bedtime.   No facility-administered encounter medications on file as of 09/11/2022.    Allergies (verified) Cephalexin  History: Past Medical History:  Diagnosis Date   Hypercholesterolemia    Hypertension    Past Surgical History:  Procedure Laterality Date   COLONOSCOPY     2015  due in 2017   Family History  Problem Relation Age of Onset   Cancer Mother    Heart disease Father    Hyperlipidemia Father    Hypertension Father    Heart attack Father    Liver disease Brother    Social History   Socioeconomic History   Marital status: Legally Separated    Spouse name: Not on file   Number of children: 3   Years of education: Not on file   Highest education level: Not on file  Occupational History   Not on file  Tobacco Use   Smoking status: Former    Years: 25.00    Types: Cigarettes    Quit date:  09/18/2012    Years since quitting: 9.9   Smokeless tobacco: Never  Vaping Use   Vaping Use: Never used  Substance and Sexual Activity   Alcohol use: No    Alcohol/week: 0.0 standard drinks of alcohol   Drug use: No   Sexual activity: Not on file  Other Topics Concern   Not on file  Social History Narrative   Not on file   Social Determinants of Health   Financial Resource Strain: Low Risk  (09/11/2022)   Overall Financial Resource Strain (CARDIA)    Difficulty of Paying Living Expenses: Not hard at all  Food Insecurity: No Food Insecurity (09/11/2022)   Hunger Vital Sign    Worried About Running Out of Food in the Last Year: Never true    Ran Out of Food in the Last Year: Never true  Transportation Needs: No Transportation Needs (09/11/2022)   PRAPARE - Administrator, Civil Service (Medical): No    Lack of Transportation (Non-Medical): No  Physical Activity: Sufficiently Active (09/11/2022)   Exercise Vital Sign    Days of Exercise per Week: 5 days    Minutes of Exercise per Session: 30 min  Stress: No Stress Concern Present (09/11/2022)   [employer] of Occupational Health - Occupational Stress Questionnaire    Feeling of Stress : Not at all  Social Connections: Socially Integrated (09/11/2022)   Social Connection and Isolation Panel [NHANES]    Frequency of Communication with Friends and Family: More than three times a week    Frequency of Social Gatherings with Friends and Family: Three times a week    Attends Religious Services: More than 4 times per year    Active Member of Clubs or Organizations: Yes    Attends Engineer, structural: More than 4 times per year    Marital Status: Married    Tobacco Counseling Counseling given: Not Answered   Clinical Intake:  Pre-visit preparation completed: Yes  Pain : No/denies pain     Diabetes: No  How often do you need to have someone help you when you read instructions, pamphlets, or other  written materials from your doctor or pharmacy?: 1 - Never  Diabetic?No   Interpreter Needed?: No  Information entered by :: Kandis Fantasia LPN   Activities of Daily Living    09/11/2022    1:44 PM 12/30/2021   11:45 PM  In your present state of health, do you have any difficulty performing the following activities:  Hearing? 0 0  Vision? 0 0  Difficulty concentrating or making decisions? 0 0  Walking or climbing stairs? 0 1  Dressing or bathing? 0 0  Doing errands, shopping? 0 0  Preparing Food and eating ? N   Using the Toilet? N   In the past six months, have you accidently leaked urine? N   Do you have problems with loss of bowel control? N   Managing your Medications? N   Managing your Finances? N   Housekeeping or managing your Housekeeping? N     Patient Care Team: Susy Frizzle, MD as PCP - General (Family Medicine) Nigel Mormon, MD as Consulting Physician (Cardiology)  Indicate any recent Medical Services you may have received from other than [facility] providers in the past year (date may be approximate).     Assessment:   This is a routine wellness examination for [facility].  Hearing/Vision screen Hearing Screening - Comments:: Denies hearing difficulties  Vision Screening - Comments::  up to date with routine eye exams    Dietary issues and exercise activities discussed: Current Exercise Habits: Home exercise routine, Type of exercise: walking, Time (Minutes): 30, Frequency (Times/Week): 3, Weekly Exercise (Minutes/Week): 90, Intensity: Mild   Goals Addressed             This Visit's Progress    Maintain Independence         Depression Screen    09/11/2022    1:36 PM 07/20/2022   11:00 AM  PHQ 2/9 Scores  PHQ - 2 Score 0 0    Fall Risk    09/11/2022    1:35 PM 07/20/2022   11:00 AM  Sunset in the past year? 0 0  Number falls in past yr: 0 0  Injury with Fall? 0 0  Risk for fall due to :  No Fall Risks  Follow up Falls  prevention discussed;Education provided;Falls evaluation completed Falls prevention discussed    FALL RISK PREVENTION PERTAINING TO THE HOME:  Any stairs in or around the home? No  If so, are there any without handrails? No  Home free of loose throw rugs in walkways, pet beds, electrical cords, etc? Yes  Adequate lighting in your home to reduce risk of falls? Yes   ASSISTIVE DEVICES UTILIZED TO PREVENT FALLS:  Life alert? No  Use of a cane, Borelli or w/c? No  Grab bars in the bathroom? Yes  Shower chair or bench in shower? No  Elevated toilet seat or a handicapped toilet? Yes   TIMED UP AND GO:  Was the test performed? No . Telephonic visit   Cognitive Function:        09/11/2022    1:44 PM  6CIT Screen  What Year? 0 points  What month? 0 points  What time? 0 points  Count back from 20 0 points  Months in reverse 0 points  Repeat phrase 0 points  Total Score 0 points    Immunizations Immunization History  Administered Date(s) Administered   COVID-19, mRNA, vaccine(Comirnaty)12 years and older 07/16/2022   Influenza-Unspecified 05/03/2014   PFIZER Comirnaty(Gray Top)Covid-19 Tri-Sucrose Vaccine 02/16/2021   PFIZER(Purple Top)SARS-COV-2 Vaccination 10/09/2019, 11/03/2019    TDAP status: Due, Education has been provided regarding the importance of this vaccine. Advised may receive this vaccine at local pharmacy or Health Dept. Aware to provide a copy of the vaccination record if obtained from local pharmacy or Health Dept. Verbalized acceptance and understanding.  Flu Vaccine status: Declined, Education has been provided regarding the importance of this vaccine but patient still declined. Advised  may receive this vaccine at local pharmacy or Health Dept. Aware to provide a copy of the vaccination record if obtained from local pharmacy or Health Dept. Verbalized acceptance and understanding.  Pneumococcal vaccine status: Declined,  Education has been provided regarding  the importance of this vaccine but patient still declined. Advised may receive this vaccine at local pharmacy or Health Dept. Aware to provide a copy of the vaccination record if obtained from local pharmacy or Health Dept. Verbalized acceptance and understanding.   Covid-19 vaccine status: Information provided on how to obtain vaccines.   Qualifies for Shingles Vaccine? Yes   Zostavax completed No   Shingrix Completed?: No.    Education has been provided regarding the importance of this vaccine. Patient has been advised to call insurance company to determine out of pocket expense if they have not yet received this vaccine. Advised may also receive vaccine at local pharmacy or Health Dept. Verbalized acceptance and understanding.  Screening Tests Health Maintenance  Topic Date Due   INFLUENZA VACCINE  11/11/2022 (Originally 03/13/2022)   Zoster Vaccines- Shingrix (1 of 2) 12/11/2022 (Originally 03/07/1989)   Pneumonia Vaccine 25+ Years old (1 - PCV) 09/12/2023 (Originally 03/07/2004)   Medicare Annual Wellness (AWV)  09/12/2023   COVID-19 Vaccine  Completed   HPV VACCINES  Aged Out   DTaP/Tdap/Td  Discontinued    Health Maintenance  There are no preventive care reminders to display for this patient.   Colorectal cancer screening: No longer required.   Lung Cancer Screening: (Low Dose CT Chest recommended if Age 42-80 years, 30 pack-year currently smoking OR have quit w/in 15years.) does not qualify.   Lung Cancer Screening Referral: n/a   Additional Screening:  Hepatitis C Screening: does not qualify  Vision Screening: Recommended annual ophthalmology exams for early detection of glaucoma and other disorders of the eye. Is the patient up to date with their annual eye exam?  Yes  Who is the provider or what is the name of the office in which the patient attends annual eye exams? Unable to provide doctor's name  If pt is not established with a provider, would they like to be referred  to a provider to establish care? No .   Dental Screening: Recommended annual dental exams for proper oral hygiene  Community Resource Referral / Chronic Care Management: CRR required this visit?  No   CCM required this visit?  No      Plan:     I have personally reviewed and noted the following in the patient's chart:   Medical and social history Use of alcohol, tobacco or illicit drugs  Current medications and supplements including opioid prescriptions. Patient is not currently taking opioid prescriptions. Functional ability and status Nutritional status Physical activity Advanced directives List of other physicians Hospitalizations, surgeries, and ER visits in previous 12 months Vitals Screenings to include cognitive, depression, and falls Referrals and appointments  In addition, I have reviewed and discussed with patient certain preventive protocols, quality metrics, and best practice recommendations. A written personalized care plan for preventive services as well as general preventive health recommendations were provided to patient.     Vanetta Mulders, [place]   9/62/9528   Due to this being a virtual visit, the after visit summary with patients personalized plan was offered to patient via mail or my-chart. Patient preferred to pick up at office at next visit  Nurse Notes: No concerns

## 2022-09-11 NOTE — Patient Instructions (Addendum)
Gary Pierce , Thank you for taking time to come for your Medicare Wellness Visit. I appreciate your ongoing commitment to your health goals. Please review the following plan we discussed and let me know if I can assist you in the future.   These are the goals we discussed:  Goals      Maintain Independence        This is a list of the screening recommended for you and due dates:  Health Maintenance  Topic Date Due   Flu Shot  11/11/2022*   Zoster (Shingles) Vaccine (1 of 2) 12/11/2022*   Pneumonia Vaccine (1 - PCV) 09/12/2023*   Medicare Annual Wellness Visit  09/12/2023   COVID-19 Vaccine  Completed   HPV Vaccine  Aged Out   DTaP/Tdap/Td vaccine  Discontinued  *Topic was postponed. The date shown is not the original due date.    Advanced directives: Please bring a copy of your health care power of attorney and living will to the office to be added to your chart at your convenience.   Conditions/risks identified: Aim for 30 minutes of exercise or brisk walking, 6-8 glasses of water, and 5 servings of fruits and vegetables each day.   Next appointment: Follow up in one year for your annual wellness visit.   Preventive Care 12 Years and Older, Male  Preventive care refers to lifestyle choices and visits with your health care provider that can promote health and wellness. What does preventive care include? A yearly physical exam. This is also called an annual well check. Dental exams once or twice a year. Routine eye exams. Ask your health care provider how often you should have your eyes checked. Personal lifestyle choices, including: Daily care of your teeth and gums. Regular physical activity. Eating a healthy diet. Avoiding tobacco and drug use. Limiting alcohol use. Practicing safe sex. Taking low doses of aspirin every day. Taking vitamin and mineral supplements as recommended by your health care provider. What happens during an annual well check? The services and  screenings done by your health care provider during your annual well check will depend on your age, overall health, lifestyle risk factors, and family history of disease. Counseling  Your health care provider may ask you questions about your: Alcohol use. Tobacco use. Drug use. Emotional well-being. Home and relationship well-being. Sexual activity. Eating habits. History of falls. Memory and ability to understand (cognition). Work and work Statistician. Screening  You may have the following tests or measurements: Height, weight, and BMI. Blood pressure. Lipid and cholesterol levels. These may be checked every 5 years, or more frequently if you are over 46 years old. Skin check. Lung cancer screening. You may have this screening every year starting at age 87 if you have a 30-pack-year history of smoking and currently smoke or have quit within the past 15 years. Fecal occult blood test (FOBT) of the stool. You may have this test every year starting at age 52. Flexible sigmoidoscopy or colonoscopy. You may have a sigmoidoscopy every 5 years or a colonoscopy every 10 years starting at age 98. Prostate cancer screening. Recommendations will vary depending on your family history and other risks. Hepatitis C blood test. Hepatitis B blood test. Sexually transmitted disease (STD) testing. Diabetes screening. This is done by checking your blood sugar (glucose) after you have not eaten for a while (fasting). You may have this done every 1-3 years. Abdominal aortic aneurysm (AAA) screening. You may need this if you are a current or  former smoker. Osteoporosis. You may be screened starting at age 79 if you are at high risk. Talk with your health care provider about your test results, treatment options, and if necessary, the need for more tests. Vaccines  Your health care provider may recommend certain vaccines, such as: Influenza vaccine. This is recommended every year. Tetanus, diphtheria, and  acellular pertussis (Tdap, Td) vaccine. You may need a Td booster every 10 years. Zoster vaccine. You may need this after age 85. Pneumococcal 13-valent conjugate (PCV13) vaccine. One dose is recommended after age 59. Pneumococcal polysaccharide (PPSV23) vaccine. One dose is recommended after age 14. Talk to your health care provider about which screenings and vaccines you need and how often you need them. This information is not intended to replace advice given to you by your health care provider. Make sure you discuss any questions you have with your health care provider. Document Released: 08/26/2015 Document Revised: 04/18/2016 Document Reviewed: 05/31/2015 Elsevier Interactive Patient Education  2017 [place] Prevention in the Home Falls can cause injuries. They can happen to people of all ages. There are many things you can do to make your home safe and to help prevent falls. What can I do on the outside of my home? Regularly fix the edges of walkways and driveways and fix any cracks. Remove anything that might make you trip as you walk through a door, such as a raised step or threshold. Trim any bushes or trees on the path to your home. Use bright outdoor lighting. Clear any walking paths of anything that might make someone trip, such as rocks or tools. Regularly check to see if handrails are loose or broken. Make sure that both sides of any steps have handrails. Any raised decks and porches should have guardrails on the edges. Have any leaves, snow, or ice cleared regularly. Use sand or salt on walking paths during winter. Clean up any spills in your garage right away. This includes oil or grease spills. What can I do in the bathroom? Use night lights. Install grab bars by the toilet and in the tub and shower. Do not use towel bars as grab bars. Use non-skid mats or decals in the tub or shower. If you need to sit down in the shower, use a plastic, non-slip stool. Keep  the floor dry. Clean up any water that spills on the floor as soon as it happens. Remove soap buildup in the tub or shower regularly. Attach bath mats securely with double-sided non-slip rug tape. Do not have throw rugs and other things on the floor that can make you trip. What can I do in the bedroom? Use night lights. Make sure that you have a light by your bed that is easy to reach. Do not use any sheets or blankets that are too big for your bed. They should not hang down onto the floor. Have a firm chair that has side arms. You can use this for support while you get dressed. Do not have throw rugs and other things on the floor that can make you trip. What can I do in the kitchen? Clean up any spills right away. Avoid walking on wet floors. Keep items that you use a lot in easy-to-reach places. If you need to reach something above you, use a strong step stool that has a grab bar. Keep electrical cords out of the way. Do not use floor polish or wax that makes floors slippery. If you must use wax, use  non-skid floor wax. Do not have throw rugs and other things on the floor that can make you trip. What can I do with my stairs? Do not leave any items on the stairs. Make sure that there are handrails on both sides of the stairs and use them. Fix handrails that are broken or loose. Make sure that handrails are as long as the stairways. Check any carpeting to make sure that it is firmly attached to the stairs. Fix any carpet that is loose or worn. Avoid having throw rugs at the top or bottom of the stairs. If you do have throw rugs, attach them to the floor with carpet tape. Make sure that you have a light switch at the top of the stairs and the bottom of the stairs. If you do not have them, ask someone to add them for you. What else can I do to help prevent falls? Wear shoes that: Do not have high heels. Have rubber bottoms. Are comfortable and fit you well. Are closed at the toe. Do not  wear sandals. If you use a stepladder: Make sure that it is fully opened. Do not climb a closed stepladder. Make sure that both sides of the stepladder are locked into place. Ask someone to hold it for you, if possible. Clearly mark and make sure that you can see: Any grab bars or handrails. First and last steps. Where the edge of each step is. Use tools that help you move around (mobility aids) if they are needed. These include: Canes. Walkers. Scooters. Crutches. Turn on the lights when you go into a dark area. Replace any light bulbs as soon as they burn out. Set up your furniture so you have a clear path. Avoid moving your furniture around. If any of your floors are uneven, fix them. If there are any pets around you, be aware of where they are. Review your medicines with your doctor. Some medicines can make you feel dizzy. This can increase your chance of falling. Ask your doctor what other things that you can do to help prevent falls. This information is not intended to replace advice given to you by your health care provider. Make sure you discuss any questions you have with your health care provider. Document Released: 05/26/2009 Document Revised: 01/05/2016 Document Reviewed: 09/03/2014 Elsevier Interactive Patient Education  2017 [employer].

## 2022-09-17 ENCOUNTER — Ambulatory Visit (INDEPENDENT_AMBULATORY_CARE_PROVIDER_SITE_OTHER): Payer: 59 | Admitting: Family Medicine

## 2022-09-17 VITALS — BP 120/76 | HR 60 | Temp 97.7°F | Ht 70.0 in | Wt 186.0 lb

## 2022-09-17 DIAGNOSIS — L89301 Pressure ulcer of unspecified buttock, stage 1: Secondary | ICD-10-CM | POA: Diagnosis not present

## 2022-09-17 NOTE — Progress Notes (Signed)
Subjective:    Patient ID: Gary Pierce, male    DOB: 03-03-1941, 82 y.o.   MRN: [id number]  Patient states that the skin over his coccyx is tender.  Is been like that for a month.  Today, the skin over the coccyx is hypopigmented.  There is no breakdown in skin.  There is no erythema or warmth.  It is slightly tender to palpation.  It is roughly 1.5 cm by half centimeter in diameter Past Medical History:  Diagnosis Date   Hypercholesterolemia    Hypertension    Past Surgical History:  Procedure Laterality Date   COLONOSCOPY     2015  due in 2017   Current Outpatient Medications on File Prior to Visit  Medication Sig Dispense Refill   albuterol (VENTOLIN HFA) 108 (90 Base) MCG/ACT inhaler Inhale 2 puffs into the lungs every 4 (four) hours as needed for wheezing or shortness of breath. 18 g 0   alfuzosin (UROXATRAL) 10 MG 24 hr tablet Take 10 mg by mouth daily as needed.     amLODipine (NORVASC) 10 MG tablet Take 10 mg by mouth daily.     aspirin 81 MG chewable tablet Chew 1 tablet (81 mg total) by mouth daily. 30 tablet 0   benzonatate (TESSALON) 100 MG capsule Take 1 capsule (100 mg total) by mouth every 8 (eight) hours. 21 capsule 0   cetirizine (ZYRTEC) 5 MG tablet Take 1 tablet (5 mg total) by mouth daily. (Patient taking differently: Take 5 mg by mouth daily as needed for allergies.) 30 tablet 0   colchicine 0.6 MG tablet Take 0.6 mg by mouth 4 (four) times daily as needed (gout).     guaiFENesin (MUCINEX) 600 MG 12 hr tablet Take 1 tablet (600 mg total) by mouth daily as needed. 30 tablet 0   labetalol (NORMODYNE) 100 MG tablet TAKE 1 TABLET BY MOUTH TWICE A DAY 180 tablet 1   loperamide (IMODIUM) 2 MG capsule Take 1 capsule (2 mg total) by mouth daily as needed for diarrhea or loose stools. 10 capsule 0   olmesartan (BENICAR) 40 MG tablet Take 40 mg by mouth daily.     ondansetron (ZOFRAN-ODT) 8 MG disintegrating tablet Take 1 tablet (8 mg total) by mouth every 8 (eight) hours as  needed for nausea or vomiting. 20 tablet 0   rosuvastatin (CRESTOR) 20 MG tablet Take 1 tablet (20 mg total) by mouth daily. 90 tablet 3   silodosin (RAPAFLO) 8 MG CAPS capsule Take 8 mg by mouth daily with breakfast.     zolpidem (AMBIEN) 10 MG tablet Take 1 tablet (10 mg total) by mouth at bedtime. 30 tablet 1   No current facility-administered medications on file prior to visit.   Allergies  Allergen Reactions   Cephalexin Diarrhea   Social History   Socioeconomic History   Marital status: Legally Separated    Spouse name: Not on file   Number of children: 3   Years of education: Not on file   Highest education level: Not on file  Occupational History   Not on file  Tobacco Use   Smoking status: Former    Years: 25.00    Types: Cigarettes    Quit date: 09/18/2012    Years since quitting: 10.0   Smokeless tobacco: Never  Vaping Use   Vaping Use: Never used  Substance and Sexual Activity   Alcohol use: No    Alcohol/week: 0.0 standard drinks of alcohol   Drug use: No  Sexual activity: Not on file  Other Topics Concern   Not on file  Social History Narrative   Not on file   Social Determinants of Health   Financial Resource Strain: Low Risk  (09/11/2022)   Overall Financial Resource Strain (CARDIA)    Difficulty of Paying Living Expenses: Not hard at all  Food Insecurity: No Food Insecurity (09/11/2022)   Hunger Vital Sign    Worried About Running Out of Food in the Last Year: Never true    Ran Out of Food in the Last Year: Never true  Transportation Needs: No Transportation Needs (09/11/2022)   PRAPARE - Hydrologist (Medical): No    Lack of Transportation (Non-Medical): No  Physical Activity: Sufficiently Active (09/11/2022)   Exercise Vital Sign    Days of Exercise per Week: 5 days    Minutes of Exercise per Session: 30 min  Stress: No Stress Concern Present (09/11/2022)   [place]    Feeling of Stress : Not at all  Social Connections: [place] (09/11/2022)   Social Connection and Isolation Panel [NHANES]    Frequency of Communication with Friends and Family: More than three times a week    Frequency of Social Gatherings with Friends and Family: Three times a week    Attends Religious Services: More than 4 times per year    Active Member of Clubs or Organizations: Yes    Attends Archivist Meetings: More than 4 times per year    Marital Status: Married  Human resources officer Violence: Not At Risk (09/11/2022)   Humiliation, Afraid, Rape, and Kick questionnaire    Fear of Current or Ex-Partner: No    Emotionally Abused: No    Physically Abused: No    Sexually Abused: No   Family History  Problem Relation Age of Onset   Cancer Mother    Heart disease Father    Hyperlipidemia Father    Hypertension Father    Heart attack Father    Liver disease Brother      Review of Systems  All other systems reviewed and are negative.      Objective:   Physical Exam Vitals reviewed.  Constitutional:      General: He is not in acute distress.    Appearance: Normal appearance. He is normal weight. He is not ill-appearing, toxic-appearing or diaphoretic.  Neck:     Vascular: No carotid bruit.  Cardiovascular:     Rate and Rhythm: Normal rate and regular rhythm.     Pulses: Normal pulses.     Heart sounds: Murmur heard.     No friction rub. No gallop.  Pulmonary:     Effort: Pulmonary effort is normal. No respiratory distress.     Breath sounds: Normal breath sounds. No stridor. No wheezing or rales.    Chest:     Chest wall: No deformity, swelling or tenderness.  Abdominal:     General: Abdomen is flat. Bowel sounds are normal. There is no distension.     Palpations: Abdomen is soft.     Tenderness: There is no abdominal tenderness. There is no guarding.  Musculoskeletal:     Thoracic back: No spasms, tenderness or bony  tenderness.     Lumbar back: No spasms, tenderness or bony tenderness.     Right lower leg: No edema.     Left lower leg: No edema.       Legs:  Lymphadenopathy:     Cervical: No cervical adenopathy.  Skin:    Findings: No erythema.  Neurological:     Mental Status: He is alert.           Assessment & Plan:   Pressure injury of buttock, stage 1, unspecified laterality Patient has a stage I pressure sore directly over the tip of his coccyx.  I recommended that he sit on a pad to offload the pressure to help this heal.  Also recommended adding a protein supplement to help gain weight to help prevent this from worsening

## 2022-09-18 ENCOUNTER — Ambulatory Visit
Admission: RE | Admit: 2022-09-18 | Discharge: 2022-09-18 | Disposition: A | Discharge status: Home / Self Care | Payer: 59 | Source: Ambulatory Visit | Attending: Family Medicine | Admitting: Family Medicine

## 2022-09-18 DIAGNOSIS — R109 Unspecified abdominal pain: Secondary | ICD-10-CM

## 2022-09-27 ENCOUNTER — Ambulatory Visit: Payer: Medicare Other | Admitting: Cardiology

## 2022-10-01 ENCOUNTER — Other Ambulatory Visit: Payer: Self-pay | Admitting: Family Medicine

## 2022-10-01 ENCOUNTER — Telehealth: Payer: Self-pay

## 2022-10-01 MED ORDER — ZOLPIDEM TARTRATE 10 MG PO TABS: 10.0000 mg | ORAL_TABLET | Freq: Every day | ORAL | 1 refills | Status: DC

## 2022-10-01 NOTE — Telephone Encounter (Signed)
Pt called requesting a refill on his Zolpidem. Last refill was 05/11/2022.  Pt uses [facility]. Thank you.

## 2022-10-05 ENCOUNTER — Ambulatory Visit: Payer: Medicare Other | Admitting: Cardiology

## 2022-10-25 NOTE — Progress Notes (Signed)
Lab visit

## 2022-11-08 ENCOUNTER — Other Ambulatory Visit: Payer: Self-pay

## 2022-11-08 DIAGNOSIS — N401 Enlarged prostate with lower urinary tract symptoms: Secondary | ICD-10-CM

## 2022-11-08 MED ORDER — ALFUZOSIN HCL ER 10 MG PO TB24: 10.0000 mg | ORAL_TABLET | Freq: Every day | ORAL | 1 refills | Status: AC | PRN

## 2022-11-08 MED ORDER — SILODOSIN 8 MG PO CAPS: 8.0000 mg | ORAL_CAPSULE | Freq: Every day | ORAL | 1 refills | Status: AC

## 2022-11-21 LAB — LAB REPORT - SCANNED
Albumin, Urine POC: 235.9
Creatinine, POC: 94.3 mg/dL
EGFR: 33
Microalb Creat Ratio: 250

## 2022-12-03 ENCOUNTER — Other Ambulatory Visit: Payer: Self-pay | Admitting: Family Medicine

## 2022-12-03 ENCOUNTER — Telehealth: Payer: Self-pay

## 2022-12-03 MED ORDER — ZOLPIDEM TARTRATE 10 MG PO TABS: 10.0000 mg | ORAL_TABLET | Freq: Every day | ORAL | 1 refills | Status: DC

## 2022-12-03 NOTE — Telephone Encounter (Signed)
Pt called requesting a refill on his Zolpidem 10 mg. Last RF was 10/01/2022.  Pt uses [facility]. Thank you.

## 2022-12-13 ENCOUNTER — Ambulatory Visit: Payer: 59 | Admitting: Family Medicine

## 2022-12-13 ENCOUNTER — Ambulatory Visit (INDEPENDENT_AMBULATORY_CARE_PROVIDER_SITE_OTHER): Payer: 59 | Admitting: Family Medicine

## 2022-12-13 ENCOUNTER — Encounter: Payer: Self-pay | Admitting: Family Medicine

## 2022-12-13 VITALS — BP 114/62 | HR 88 | Temp 97.4°F | Ht 70.0 in | Wt 181.0 lb

## 2022-12-13 DIAGNOSIS — J4521 Mild intermittent asthma with (acute) exacerbation: Secondary | ICD-10-CM | POA: Diagnosis not present

## 2022-12-13 DIAGNOSIS — J189 Pneumonia, unspecified organism: Secondary | ICD-10-CM | POA: Diagnosis not present

## 2022-12-13 MED ORDER — LEVOFLOXACIN 500 MG PO TABS: 500.0000 mg | ORAL_TABLET | Freq: Every day | ORAL | 0 refills | Status: AC

## 2022-12-13 MED ORDER — PREDNISONE 20 MG PO TABS: ORAL_TABLET | ORAL | 0 refills | Status: AC

## 2022-12-13 MED ORDER — ALBUTEROL SULFATE HFA 108 (90 BASE) MCG/ACT IN AERS: 2.0000 | INHALATION_SPRAY | Freq: Four times a day (QID) | RESPIRATORY_TRACT | 0 refills | Status: AC | PRN

## 2022-12-13 NOTE — Progress Notes (Signed)
Subjective:    Patient ID: Gary Pierce, male    DOB: Oct 25, 1940, 82 y.o.   MRN: [id number]  Patient has had a terrible cough for 6 or 7 days.  He reports increasing shortness of breath.  He reports a cough productive of purulent sputum.  He has had brown and bloody sputum.  He reports some right-sided chest pain and pleurisy.  On examination today he has diffuse expiratory wheezing in all 4 lung fields.  He is having an obvious asthma attack.  He has not been using his inhaler.  However his right lung there are marked crackles and rhonchi in the lower and mid lung fields.  This is asymmetric compared to the left side.  He denies any fever or chills Past Medical History:  Diagnosis Date   Hypercholesterolemia    Hypertension    Past Surgical History:  Procedure Laterality Date   COLONOSCOPY     2015  due in 2017   Current Outpatient Medications on File Prior to Visit  Medication Sig Dispense Refill   albuterol (VENTOLIN HFA) 108 (90 Base) MCG/ACT inhaler Inhale 2 puffs into the lungs every 4 (four) hours as needed for wheezing or shortness of breath. 18 g 0   alfuzosin (UROXATRAL) 10 MG 24 hr tablet Take 1 tablet (10 mg total) by mouth daily as needed. 90 tablet 1   amLODipine (NORVASC) 10 MG tablet Take 10 mg by mouth daily.     aspirin 81 MG chewable tablet Chew 1 tablet (81 mg total) by mouth daily. 30 tablet 0   benzonatate (TESSALON) 100 MG capsule Take 1 capsule (100 mg total) by mouth every 8 (eight) hours. 21 capsule 0   cetirizine (ZYRTEC) 5 MG tablet Take 1 tablet (5 mg total) by mouth daily. (Patient taking differently: Take 5 mg by mouth daily as needed for allergies.) 30 tablet 0   colchicine 0.6 MG tablet Take 0.6 mg by mouth 4 (four) times daily as needed (gout).     guaiFENesin (MUCINEX) 600 MG 12 hr tablet Take 1 tablet (600 mg total) by mouth daily as needed. 30 tablet 0   labetalol (NORMODYNE) 100 MG tablet TAKE 1 TABLET BY MOUTH TWICE A DAY 180 tablet 1   loperamide  (IMODIUM) 2 MG capsule Take 1 capsule (2 mg total) by mouth daily as needed for diarrhea or loose stools. 10 capsule 0   olmesartan (BENICAR) 40 MG tablet Take 40 mg by mouth daily.     ondansetron (ZOFRAN-ODT) 8 MG disintegrating tablet Take 1 tablet (8 mg total) by mouth every 8 (eight) hours as needed for nausea or vomiting. 20 tablet 0   rosuvastatin (CRESTOR) 20 MG tablet Take 1 tablet (20 mg total) by mouth daily. 90 tablet 3   silodosin (RAPAFLO) 8 MG CAPS capsule Take 1 capsule (8 mg total) by mouth daily with breakfast. 90 capsule 1   zolpidem (AMBIEN) 10 MG tablet Take 1 tablet (10 mg total) by mouth at bedtime. 30 tablet 1   No current facility-administered medications on file prior to visit.   Allergies  Allergen Reactions   Cephalexin Diarrhea   Sulfamethoxazole-Trimethoprim Nausea And Vomiting   Social History   Socioeconomic History   Marital status: Legally Separated    Spouse name: Not on file   Number of children: 3   Years of education: Not on file   Highest education level: Not on file  Occupational History   Not on file  Tobacco Use   Smoking  status: Former    Years: 25    Types: Cigarettes    Quit date: 09/18/2012    Years since quitting: 10.2   Smokeless tobacco: Never  Vaping Use   Vaping Use: Never used  Substance and Sexual Activity   Alcohol use: No    Alcohol/week: 0.0 standard drinks of alcohol   Drug use: No   Sexual activity: Not on file  Other Topics Concern   Not on file  Social History Narrative   Not on file   Social Determinants of Health   Financial Resource Strain: Low Risk  (09/11/2022)   Overall Financial Resource Strain (CARDIA)    Difficulty of Paying Living Expenses: Not hard at all  Food Insecurity: No Food Insecurity (09/11/2022)   Hunger Vital Sign    Worried About Running Out of Food in the Last Year: Never true    Ran Out of Food in the Last Year: Never true  Transportation Needs: No Transportation Needs (09/11/2022)    PRAPARE - Administrator, Civil Service (Medical): No    Lack of Transportation (Non-Medical): No  Physical Activity: Sufficiently Active (09/11/2022)   Exercise Vital Sign    Days of Exercise per Week: 5 days    Minutes of Exercise per Session: 30 min  Stress: No Stress Concern Present (09/11/2022)   [employer] of Occupational Health - Occupational Stress Questionnaire    Feeling of Stress : Not at all  Social Connections: Socially [place] (09/11/2022)   Social Connection and Isolation Panel [NHANES]    Frequency of Communication with Friends and Family: More than three times a week    Frequency of Social Gatherings with Friends and Family: Three times a week    Attends Religious Services: More than 4 times per year    Active Member of Clubs or Organizations: Yes    Attends Banker Meetings: More than 4 times per year    Marital Status: Married  Catering manager Violence: Not At Risk (09/11/2022)   Humiliation, Afraid, Rape, and Kick questionnaire    Fear of Current or Ex-Partner: No    Emotionally Abused: No    Physically Abused: No    Sexually Abused: No   Family History  Problem Relation Age of Onset   Cancer Mother    Heart disease Father    Hyperlipidemia Father    Hypertension Father    Heart attack Father    Liver disease Brother      Review of Systems  Respiratory:  Positive for cough.   All other systems reviewed and are negative.      Objective:   Physical Exam Vitals reviewed.  Constitutional:      General: He is not in acute distress.    Appearance: Normal appearance. He is normal weight. He is not ill-appearing, toxic-appearing or diaphoretic.  HENT:     Nose: Congestion and rhinorrhea present.  Neck:     Vascular: No carotid bruit.  Cardiovascular:     Rate and Rhythm: Normal rate and regular rhythm.     Pulses: Normal pulses.     Heart sounds: Murmur heard.     No friction rub. No gallop.  Pulmonary:     Effort:  Pulmonary effort is normal. No respiratory distress.     Breath sounds: No stridor. Examination of the right-upper field reveals wheezing. Examination of the left-upper field reveals wheezing. Examination of the right-middle field reveals wheezing, rhonchi and rales. Examination of the left-middle field reveals  wheezing. Examination of the right-lower field reveals wheezing, rhonchi and rales. Examination of the left-lower field reveals wheezing. Wheezing, rhonchi and rales present.  Chest:     Chest wall: No deformity, swelling or tenderness.  Abdominal:     General: Abdomen is flat. Bowel sounds are normal. There is no distension.     Palpations: Abdomen is soft.     Tenderness: There is no abdominal tenderness. There is no guarding.  Musculoskeletal:     Cervical back: No rigidity.     Thoracic back: No spasms, tenderness or bony tenderness.     Lumbar back: No spasms, tenderness or bony tenderness.     Right lower leg: No edema.     Left lower leg: No edema.  Lymphadenopathy:     Cervical: No cervical adenopathy.  Skin:    Findings: No erythema.  Neurological:     Mental Status: He is alert.           Assessment & Plan:   Pneumonia of right middle lobe due to infectious organism - Plan: DG Chest 2 View, CBC with Differential/Platelet, BASIC METABOLIC PANEL WITH GFR  Mild intermittent asthma with exacerbation Patient appears to be having an asthma exacerbation and may also be dealing with a right-sided pneumonia.  Begin prednisone taper pack for asthma attack.  Use albuterol 2 puffs every 6 hours until breathing improves.  Begin Levaquin 500 mg daily for 7 days.  Obtain chest x-ray along with CBC and ED.  Recheck in 48 hours if no better or immediately if worsening

## 2022-12-14 ENCOUNTER — Ambulatory Visit
Admission: RE | Admit: 2022-12-14 | Discharge: 2022-12-14 | Disposition: A | Discharge status: Home / Self Care | Payer: 59 | Source: Ambulatory Visit | Attending: Family Medicine | Admitting: Family Medicine

## 2022-12-14 DIAGNOSIS — J189 Pneumonia, unspecified organism: Secondary | ICD-10-CM

## 2022-12-14 LAB — BASIC METABOLIC PANEL WITH GFR
BUN/Creatinine Ratio: 16 (calc) (ref 6–22)
BUN: 48 mg/dL — ABNORMAL HIGH (ref 7–25)
CO2: 20 mmol/L (ref 20–32)
Calcium: 8.9 mg/dL (ref 8.6–10.3)
Chloride: 109 mmol/L (ref 98–110)
Creat: 3.09 mg/dL — ABNORMAL HIGH (ref 0.70–1.22)
Glucose, Bld: 134 mg/dL — ABNORMAL HIGH (ref 65–99)
Potassium: 4.6 mmol/L (ref 3.5–5.3)
Sodium: 142 mmol/L (ref 135–146)
eGFR: 19 mL/min/{1.73_m2} — ABNORMAL LOW (ref >=60)

## 2022-12-14 LAB — CBC WITH DIFFERENTIAL/PLATELET
Absolute Monocytes: 310 cells/uL (ref 200–950)
Basophils Absolute: 19 cells/uL (ref 0–200)
Basophils Relative: 0.4 %
Eosinophils Absolute: 89 cells/uL (ref 15–500)
Eosinophils Relative: 1.9 %
HCT: 34 % — ABNORMAL LOW (ref 38.5–50.0)
Hemoglobin: 11.7 g/dL — ABNORMAL LOW (ref 13.2–17.1)
Lymphs Abs: 2317 cells/uL (ref 850–3900)
MCH: 27.5 pg (ref 27.0–33.0)
MCHC: 34.4 g/dL (ref 32.0–36.0)
MCV: 79.8 fL — ABNORMAL LOW (ref 80.0–100.0)
MPV: 12 fL (ref 7.5–12.5)
Monocytes Relative: 6.6 %
Neutro Abs: 1965 cells/uL (ref 1500–7800)
Neutrophils Relative %: 41.8 %
Platelets: 212 10*3/uL (ref 140–400)
RBC: 4.26 10*6/uL (ref 4.20–5.80)
RDW: 14.7 % (ref 11.0–15.0)
Total Lymphocyte: 49.3 %
WBC: 4.7 10*3/uL (ref 3.8–10.8)

## 2022-12-17 ENCOUNTER — Ambulatory Visit (INDEPENDENT_AMBULATORY_CARE_PROVIDER_SITE_OTHER): Payer: 59 | Admitting: Family Medicine

## 2022-12-17 ENCOUNTER — Encounter: Payer: Self-pay | Admitting: Family Medicine

## 2022-12-17 VITALS — BP 134/80 | HR 90 | Temp 98.3°F | Ht 70.0 in | Wt 183.0 lb

## 2022-12-17 DIAGNOSIS — J4521 Mild intermittent asthma with (acute) exacerbation: Secondary | ICD-10-CM | POA: Diagnosis not present

## 2022-12-17 DIAGNOSIS — N184 Chronic kidney disease, stage 4 (severe): Secondary | ICD-10-CM | POA: Diagnosis not present

## 2022-12-17 DIAGNOSIS — N289 Disorder of kidney and ureter, unspecified: Secondary | ICD-10-CM | POA: Diagnosis not present

## 2022-12-17 NOTE — Progress Notes (Signed)
Subjective:    Patient ID: Gary Pierce, male    DOB: Feb 20, 1941, 82 y.o.   MRN: [id number] 12/13/22 Patient has had a terrible cough for 6 or 7 days.  He reports increasing shortness of breath.  He reports a cough productive of purulent sputum.  He has had brown and bloody sputum.  He reports some right-sided chest pain and pleurisy.  On examination today he has diffuse expiratory wheezing in all 4 lung fields.  He is having an obvious asthma attack.  He has not been using his inhaler.  However his right lung there are marked crackles and rhonchi in the lower and mid lung fields.  This is asymmetric compared to the left side.  He denies any fever or chills.  At that time, my plan was: Patient appears to be having an asthma exacerbation and may also be dealing with a right-sided pneumonia.  Begin prednisone taper pack for asthma attack.  Use albuterol 2 puffs every 6 hours until breathing improves.  Begin Levaquin 500 mg daily for 7 days.  Obtain chest x-ray along with CBC and ED.  Recheck in 48 hours if no better or immediately if worsening  12/17/22 Labs showed a creatinine of 3 above the baseline creatinine of 2.  GFR has dropped to 19.  As result I asked the patient to hold his olmesartan, push fluids, and reduce his Levaquin to 250 mg every other day.  He has taken 2 doses of Levaquin.  His breathing has improved dramatically.  Today he is no longer wheezing.  His lungs sound completely clear.  The rhonchi and rales that I heard last week have resolved.  Chest x-ray shows no visible pneumonia although radiology has still not read the x-ray report yet Past Medical History:  Diagnosis Date   Hypercholesterolemia    Hypertension    Past Surgical History:  Procedure Laterality Date   COLONOSCOPY     2015  due in 2017   Current Outpatient Medications on File Prior to Visit  Medication Sig Dispense Refill   albuterol (VENTOLIN HFA) 108 (90 Base) MCG/ACT inhaler Inhale 2 puffs into the lungs every 4  (four) hours as needed for wheezing or shortness of breath. 18 g 0   albuterol (VENTOLIN HFA) 108 (90 Base) MCG/ACT inhaler Inhale 2 puffs into the lungs every 6 (six) hours as needed for wheezing or shortness of breath. 8 g 0   amLODipine (NORVASC) 10 MG tablet Take 10 mg by mouth daily.     aspirin 81 MG chewable tablet Chew 1 tablet (81 mg total) by mouth daily. 30 tablet 0   benzonatate (TESSALON) 100 MG capsule Take 1 capsule (100 mg total) by mouth every 8 (eight) hours. 21 capsule 0   cetirizine (ZYRTEC) 5 MG tablet Take 1 tablet (5 mg total) by mouth daily. (Patient taking differently: Take 5 mg by mouth daily as needed for allergies.) 30 tablet 0   colchicine 0.6 MG tablet Take 0.6 mg by mouth 4 (four) times daily as needed (gout).     guaiFENesin (MUCINEX) 600 MG 12 hr tablet Take 1 tablet (600 mg total) by mouth daily as needed. 30 tablet 0   labetalol (NORMODYNE) 100 MG tablet TAKE 1 TABLET BY MOUTH TWICE A DAY 180 tablet 1   levofloxacin (LEVAQUIN) 500 MG tablet Take 1 tablet (500 mg total) by mouth daily for 7 days. 7 tablet 0   loperamide (IMODIUM) 2 MG capsule Take 1 capsule (2 mg total) by  mouth daily as needed for diarrhea or loose stools. 10 capsule 0   olmesartan (BENICAR) 40 MG tablet Take 40 mg by mouth daily.     ondansetron (ZOFRAN-ODT) 8 MG disintegrating tablet Take 1 tablet (8 mg total) by mouth every 8 (eight) hours as needed for nausea or vomiting. 20 tablet 0   predniSONE (DELTASONE) 20 MG tablet 3 tabs poqday 1-2, 2 tabs poqday 3-4, 1 tab poqday 5-6 12 tablet 0   rosuvastatin (CRESTOR) 20 MG tablet Take 1 tablet (20 mg total) by mouth daily. 90 tablet 3   zolpidem (AMBIEN) 10 MG tablet Take 1 tablet (10 mg total) by mouth at bedtime. 30 tablet 1   alfuzosin (UROXATRAL) 10 MG 24 hr tablet Take 1 tablet (10 mg total) by mouth daily as needed. (Patient not taking: Reported on 12/17/2022) 90 tablet 1   silodosin (RAPAFLO) 8 MG CAPS capsule Take 1 capsule (8 mg total) by  mouth daily with breakfast. (Patient not taking: Reported on 12/17/2022) 90 capsule 1   No current facility-administered medications on file prior to visit.   Allergies  Allergen Reactions   Cephalexin Diarrhea   Sulfamethoxazole-Trimethoprim Nausea And Vomiting   Social History   Socioeconomic History   Marital status: Legally Separated    Spouse name: Not on file   Number of children: 3   Years of education: Not on file   Highest education level: Not on file  Occupational History   Not on file  Tobacco Use   Smoking status: Former    Years: 25    Types: Cigarettes    Quit date: 09/18/2012    Years since quitting: 10.2   Smokeless tobacco: Never  Vaping Use   Vaping Use: Never used  Substance and Sexual Activity   Alcohol use: No    Alcohol/week: 0.0 standard drinks of alcohol   Drug use: No   Sexual activity: Not on file  Other Topics Concern   Not on file  Social History Narrative   Not on file   Social Determinants of Health   Financial Resource Strain: Low Risk  (09/11/2022)   Overall Financial Resource Strain (CARDIA)    Difficulty of Paying Living Expenses: Not hard at all  Food Insecurity: No Food Insecurity (09/11/2022)   Hunger Vital Sign    Worried About Running Out of Food in the Last Year: Never true    Ran Out of Food in the Last Year: Never true  Transportation Needs: No Transportation Needs (09/11/2022)   PRAPARE - Administrator, Civil Service (Medical): No    Lack of Transportation (Non-Medical): No  Physical Activity: Sufficiently Active (09/11/2022)   Exercise Vital Sign    Days of Exercise per Week: 5 days    Minutes of Exercise per Session: 30 min  Stress: No Stress Concern Present (09/11/2022)   [employer] of Occupational Health - Occupational Stress Questionnaire    Feeling of Stress : Not at all  Social Connections: Socially Integrated (09/11/2022)   Social Connection and Isolation Panel [NHANES]    Frequency of  Communication with Friends and Family: More than three times a week    Frequency of Social Gatherings with Friends and Family: Three times a week    Attends Religious Services: More than 4 times per year    Active Member of Clubs or Organizations: Yes    Attends Banker Meetings: More than 4 times per year    Marital Status: Married  Catering manager  Violence: Not At Risk (09/11/2022)   Humiliation, Afraid, Rape, and Kick questionnaire    Fear of Current or Ex-Partner: No    Emotionally Abused: No    Physically Abused: No    Sexually Abused: No   Family History  Problem Relation Age of Onset   Cancer Mother    Heart disease Father    Hyperlipidemia Father    Hypertension Father    Heart attack Father    Liver disease Brother      Review of Systems  Respiratory:  Positive for cough.   All other systems reviewed and are negative.      Objective:   Physical Exam Vitals reviewed.  Constitutional:      General: He is not in acute distress.    Appearance: Normal appearance. He is normal weight. He is not ill-appearing, toxic-appearing or diaphoretic.  Neck:     Vascular: No carotid bruit.  Cardiovascular:     Rate and Rhythm: Normal rate and regular rhythm.     Pulses: Normal pulses.     Heart sounds: Murmur heard.     No friction rub. No gallop.  Pulmonary:     Effort: Pulmonary effort is normal. No respiratory distress.     Breath sounds: No stridor. No wheezing, rhonchi or rales.  Chest:     Chest wall: No deformity, swelling or tenderness.  Abdominal:     General: Abdomen is flat. Bowel sounds are normal. There is no distension.     Palpations: Abdomen is soft.     Tenderness: There is no abdominal tenderness. There is no guarding.  Musculoskeletal:     Cervical back: No rigidity.     Thoracic back: No spasms, tenderness or bony tenderness.     Lumbar back: No spasms, tenderness or bony tenderness.     Right lower leg: No edema.     Left lower leg:  No edema.  Lymphadenopathy:     Cervical: No cervical adenopathy.  Skin:    Findings: No erythema.  Neurological:     Mental Status: He is alert.           Assessment & Plan:   Renal insufficiency - Plan: BASIC METABOLIC PANEL WITH GFR  CKD (chronic kidney disease) stage 4, GFR 15-29 ml/min (HCC)  Mild intermittent asthma with exacerbation Respiratory issues have improved dramatically since starting prednisone and liberalizing his use of albuterol.  Chest x-ray shows no obvious pneumonia and lung sounds are much better.  Therefore I have asked the patient to take 1 additional dose of Levaquin and then he can stop.  Finish prednisone as prescribed.  Use albuterol only as needed.  We discussed starting a maintenance medication such as Advair.  Patient states he is only have 1 asthma exacerbation his entire life as severe as this.  He states that he seldom has to use his albuterol.  Therefore we have decided to start a preventative medication today.  My biggest concern is his underlying renal function.  There has been a significant decline in his creatinine.  Repeat BMP today.  Hold olmesartan.  Push fluids.

## 2022-12-18 LAB — BASIC METABOLIC PANEL WITH GFR
BUN/Creatinine Ratio: 24 (calc) — ABNORMAL HIGH (ref 6–22)
BUN: 43 mg/dL — ABNORMAL HIGH (ref 7–25)
CO2: 22 mmol/L (ref 20–32)
Calcium: 9 mg/dL (ref 8.6–10.3)
Chloride: 110 mmol/L (ref 98–110)
Creat: 1.79 mg/dL — ABNORMAL HIGH (ref 0.70–1.22)
Glucose, Bld: 108 mg/dL — ABNORMAL HIGH (ref 65–99)
Potassium: 5.1 mmol/L (ref 3.5–5.3)
Sodium: 140 mmol/L (ref 135–146)
eGFR: 37 mL/min/{1.73_m2} — ABNORMAL LOW (ref >=60)

## 2023-01-04 LAB — HM DIABETES EYE EXAM

## 2023-01-17 ENCOUNTER — Encounter: Payer: Self-pay | Admitting: Family Medicine

## 2023-02-18 ENCOUNTER — Telehealth: Payer: Self-pay

## 2023-02-18 ENCOUNTER — Other Ambulatory Visit: Payer: Self-pay | Admitting: Family Medicine

## 2023-02-18 MED ORDER — ZOLPIDEM TARTRATE 10 MG PO TABS: 10.0000 mg | ORAL_TABLET | Freq: Every day | ORAL | 1 refills | Status: DC

## 2023-02-18 NOTE — Telephone Encounter (Signed)
Pt called requesting a refill on his Zolpidem.  Last RF was 12/03/2022. Thanks.

## 2023-02-22 ENCOUNTER — Telehealth: Payer: Self-pay | Admitting: Family Medicine

## 2023-02-22 ENCOUNTER — Other Ambulatory Visit: Payer: Self-pay

## 2023-02-22 DIAGNOSIS — N184 Chronic kidney disease, stage 4 (severe): Secondary | ICD-10-CM

## 2023-02-22 DIAGNOSIS — R7303 Prediabetes: Secondary | ICD-10-CM

## 2023-02-22 MED ORDER — ACCU-CHEK GUIDE W/DEVICE KIT
PACK | 0 refills | Status: AC
Start: 2023-02-22 — End: ?

## 2023-02-22 MED ORDER — ACCU-CHEK SOFTCLIX LANCETS MISC
12 refills | Status: AC
Start: 2023-02-22 — End: ?

## 2023-02-22 MED ORDER — ACCU-CHEK GUIDE VI STRP
ORAL_STRIP | 12 refills | Status: AC
Start: 2023-02-22 — End: ?

## 2023-02-22 NOTE — Telephone Encounter (Signed)
Prescription Request  02/22/2023  LOV: 12/17/2022  What is the name of the medication or equipment?   ACCU-CHEK GUIDE MONITOR SYSTEM  ACCU-CHEK GUIDE TEST STRIP  ACCU-CHEK SOFTCLIX LANCETS  Have you contacted your pharmacy to request a refill? Yes   Which pharmacy would you like this sent to?  [facility] - [place], [place] - [address] [place] [address] Phone: [phone] Fax: [phone]    Patient notified that their request is being sent to the clinical staff for review and that they should receive a response within 2 business days.   Please advise pharmacist.

## 2023-03-06 DIAGNOSIS — H10413 Chronic giant papillary conjunctivitis, bilateral: Secondary | ICD-10-CM | POA: Diagnosis not present

## 2023-03-14 ENCOUNTER — Encounter: Payer: Self-pay | Admitting: Family Medicine

## 2023-03-14 ENCOUNTER — Ambulatory Visit (INDEPENDENT_AMBULATORY_CARE_PROVIDER_SITE_OTHER): Payer: Medicare HMO | Admitting: Family Medicine

## 2023-03-14 VITALS — BP 114/62 | HR 69 | Temp 97.9°F | Ht 70.0 in | Wt 188.6 lb

## 2023-03-14 DIAGNOSIS — N184 Chronic kidney disease, stage 4 (severe): Secondary | ICD-10-CM | POA: Diagnosis not present

## 2023-03-14 NOTE — Progress Notes (Signed)
Subjective:    Patient ID: Gary Pierce, male    DOB: 17-Apr-1941, 82 y.o.   MRN: [id number] Patient is a very pleasant 82 year old African-American gentleman who presents today complaining of right hip pain.  He states that last week, he was having pain in the lateral portion of his right hip in the area directly over the greater trochanter.  Even today he is mildly tender to palpation in that area.  He has no pain with flexion or extension of the hip or internal or external rotation.  However the pain is much better today.  Additionally he requested cortisone injection.  He is that he had 1 earliest year and a 1 way for quite some time.  However the pain is better now and so he no longer wants the injection.  His blood pressure today is excellent. Past Medical History:  Diagnosis Date   Hypercholesterolemia    Hypertension    Past Surgical History:  Procedure Laterality Date   COLONOSCOPY     2015  due in 2017   Current Outpatient Medications on File Prior to Visit  Medication Sig Dispense Refill   Accu-Chek Softclix Lancets lancets Use to check blood sugar fasting, every morning. 100 each 12   albuterol (VENTOLIN HFA) 108 (90 Base) MCG/ACT inhaler Inhale 2 puffs into the lungs every 4 (four) hours as needed for wheezing or shortness of breath. 18 g 0   albuterol (VENTOLIN HFA) 108 (90 Base) MCG/ACT inhaler Inhale 2 puffs into the lungs every 6 (six) hours as needed for wheezing or shortness of breath. 8 g 0   alfuzosin (UROXATRAL) 10 MG 24 hr tablet Take 1 tablet (10 mg total) by mouth daily as needed. (Patient not taking: Reported on 12/17/2022) 90 tablet 1   amLODipine (NORVASC) 10 MG tablet Take 10 mg by mouth daily.     aspirin 81 MG chewable tablet Chew 1 tablet (81 mg total) by mouth daily. 30 tablet 0   benzonatate (TESSALON) 100 MG capsule Take 1 capsule (100 mg total) by mouth every 8 (eight) hours. 21 capsule 0   Blood Glucose Monitoring Suppl (ACCU-CHEK GUIDE) w/Device KIT Use as  directed to check blood sugar daily. 1 kit 0   cetirizine (ZYRTEC) 5 MG tablet Take 1 tablet (5 mg total) by mouth daily. (Patient taking differently: Take 5 mg by mouth daily as needed for allergies.) 30 tablet 0   colchicine 0.6 MG tablet Take 0.6 mg by mouth 4 (four) times daily as needed (gout).     glucose blood (ACCU-CHEK GUIDE) test strip Use to check blood sugar fasting, every morning. 100 each 12   guaiFENesin (MUCINEX) 600 MG 12 hr tablet Take 1 tablet (600 mg total) by mouth daily as needed. 30 tablet 0   labetalol (NORMODYNE) 100 MG tablet TAKE 1 TABLET BY MOUTH TWICE A DAY 180 tablet 1   loperamide (IMODIUM) 2 MG capsule Take 1 capsule (2 mg total) by mouth daily as needed for diarrhea or loose stools. 10 capsule 0   olmesartan (BENICAR) 40 MG tablet Take 40 mg by mouth daily.     ondansetron (ZOFRAN-ODT) 8 MG disintegrating tablet Take 1 tablet (8 mg total) by mouth every 8 (eight) hours as needed for nausea or vomiting. 20 tablet 0   predniSONE (DELTASONE) 20 MG tablet 3 tabs poqday 1-2, 2 tabs poqday 3-4, 1 tab poqday 5-6 12 tablet 0   rosuvastatin (CRESTOR) 20 MG tablet Take 1 tablet (20 mg total) by mouth  daily. 90 tablet 3   silodosin (RAPAFLO) 8 MG CAPS capsule Take 1 capsule (8 mg total) by mouth daily with breakfast. (Patient not taking: Reported on 12/17/2022) 90 capsule 1   zolpidem (AMBIEN) 10 MG tablet Take 1 tablet (10 mg total) by mouth at bedtime. 30 tablet 1   No current facility-administered medications on file prior to visit.   Allergies  Allergen Reactions   Cephalexin Diarrhea   Sulfamethoxazole-Trimethoprim Nausea And Vomiting   Social History   Socioeconomic History   Marital status: Legally Separated    Spouse name: Not on file   Number of children: 3   Years of education: Not on file   Highest education level: Not on file  Occupational History   Not on file  Tobacco Use   Smoking status: Former    Current packs/day: 0.00    Types: Cigarettes     Start date: 09/19/1987    Quit date: 09/18/2012    Years since quitting: 10.4   Smokeless tobacco: Never  Vaping Use   Vaping status: Never Used  Substance and Sexual Activity   Alcohol use: No    Alcohol/week: 0.0 standard drinks of alcohol   Drug use: No   Sexual activity: Not on file  Other Topics Concern   Not on file  Social History Narrative   Not on file   Social Determinants of Health   Financial Resource Strain: Low Risk  (09/11/2022)   Overall Financial Resource Strain (CARDIA)    Difficulty of Paying Living Expenses: Not hard at all  Food Insecurity: No Food Insecurity (09/11/2022)   Hunger Vital Sign    Worried About Running Out of Food in the Last Year: Never true    Ran Out of Food in the Last Year: Never true  Transportation Needs: No Transportation Needs (09/11/2022)   PRAPARE - Administrator, Civil Service (Medical): No    Lack of Transportation (Non-Medical): No  Physical Activity: Sufficiently Active (09/11/2022)   Exercise Vital Sign    Days of Exercise per Week: 5 days    Minutes of Exercise per Session: 30 min  Stress: No Stress Concern Present (09/11/2022)   [employer] of Occupational Health - Occupational Stress Questionnaire    Feeling of Stress : Not at all  Social Connections: Socially Integrated (09/11/2022)   Social Connection and Isolation Panel [NHANES]    Frequency of Communication with Friends and Family: More than three times a week    Frequency of Social Gatherings with Friends and Family: Three times a week    Attends Religious Services: More than 4 times per year    Active Member of Clubs or Organizations: Yes    Attends Banker Meetings: More than 4 times per year    Marital Status: Married  Catering manager Violence: Not At Risk (09/11/2022)   Humiliation, Afraid, Rape, and Kick questionnaire    Fear of Current or Ex-Partner: No    Emotionally Abused: No    Physically Abused: No    Sexually Abused: No    Family History  Problem Relation Age of Onset   Cancer Mother    Heart disease Father    Hyperlipidemia Father    Hypertension Father    Heart attack Father    Liver disease Brother      Review of Systems  All other systems reviewed and are negative.      Objective:   Physical Exam Vitals reviewed.  Constitutional:  General: He is not in acute distress.    Appearance: Normal appearance. He is normal weight. He is not ill-appearing, toxic-appearing or diaphoretic.  Neck:     Vascular: No carotid bruit.  Cardiovascular:     Rate and Rhythm: Normal rate and regular rhythm.     Pulses: Normal pulses.     Heart sounds: Murmur heard.     No friction rub. No gallop.  Pulmonary:     Effort: Pulmonary effort is normal. No respiratory distress.     Breath sounds: No stridor. No wheezing, rhonchi or rales.  Chest:     Chest wall: No deformity, swelling or tenderness.  Abdominal:     General: Abdomen is flat. Bowel sounds are normal. There is no distension.     Palpations: Abdomen is soft.     Tenderness: There is no abdominal tenderness. There is no guarding.  Musculoskeletal:     Cervical back: No rigidity.     Thoracic back: No spasms, tenderness or bony tenderness.     Lumbar back: No spasms, tenderness or bony tenderness.     Left hip: Tenderness present. No deformity, bony tenderness or crepitus. Normal range of motion. Normal strength.     Right lower leg: No edema.     Left lower leg: No edema.       Legs:  Lymphadenopathy:     Cervical: No cervical adenopathy.  Skin:    Findings: No erythema.  Neurological:     Mental Status: He is alert.           Assessment & Plan:   CKD (chronic kidney disease) stage 4, GFR 15-29 ml/min (HCC) - Plan: BASIC METABOLIC PANEL WITH GFR I suspect the patient had greater trochanteric bursitis but it is now calm down.  He declines a cortisone shot today.  I reemphasized that he should not take NSAIDs because of his  chronic kidney disease.  He can take Tylenol.  I will recheck his kidneys today.  His creatinine is elevated again we may need to discontinue Benicar altogether.

## 2023-04-05 ENCOUNTER — Other Ambulatory Visit: Payer: Self-pay

## 2023-04-05 ENCOUNTER — Telehealth: Payer: Self-pay | Admitting: Family Medicine

## 2023-04-05 DIAGNOSIS — N289 Disorder of kidney and ureter, unspecified: Secondary | ICD-10-CM

## 2023-04-05 DIAGNOSIS — E782 Mixed hyperlipidemia: Secondary | ICD-10-CM

## 2023-04-05 DIAGNOSIS — N184 Chronic kidney disease, stage 4 (severe): Secondary | ICD-10-CM

## 2023-04-05 DIAGNOSIS — R7303 Prediabetes: Secondary | ICD-10-CM

## 2023-04-05 MED ORDER — DEXCOM G7 SENSOR MISC: 3 refills | Status: AC

## 2023-04-05 MED ORDER — DEXCOM G7 RECEIVER DEVI
0 refills | Status: AC
Start: 2023-04-05 — End: ?

## 2023-04-05 NOTE — Telephone Encounter (Signed)
Prescription Request  04/05/2023  LOV: 03/14/2023  What is the name of the medication or equipment?   DEXCOM WEBLEAD G7 CONTINUOUS GLUCOSE MONITORING SYSTEM  Have you contacted your pharmacy to request a refill? Yes   Which pharmacy would you like this sent to?    [facility] (New Address) - [place], [place] - [address] AT Previously: [address], [place] [address] [facility] [address] [place] [address] Phone: [phone] Fax: [phone]    Patient notified that their request is being sent to the clinical staff for review and that they should receive a response within 2 business days.   Please advise pharmacist.

## 2023-05-21 DIAGNOSIS — N1832 Chronic kidney disease, stage 3b: Secondary | ICD-10-CM | POA: Diagnosis not present

## 2023-05-21 DIAGNOSIS — E785 Hyperlipidemia, unspecified: Secondary | ICD-10-CM | POA: Diagnosis not present

## 2023-05-21 DIAGNOSIS — N400 Enlarged prostate without lower urinary tract symptoms: Secondary | ICD-10-CM | POA: Diagnosis not present

## 2023-05-21 DIAGNOSIS — N2581 Secondary hyperparathyroidism of renal origin: Secondary | ICD-10-CM | POA: Diagnosis not present

## 2023-05-21 DIAGNOSIS — I129 Hypertensive chronic kidney disease with stage 1 through stage 4 chronic kidney disease, or unspecified chronic kidney disease: Secondary | ICD-10-CM | POA: Diagnosis not present

## 2023-05-21 DIAGNOSIS — D631 Anemia in chronic kidney disease: Secondary | ICD-10-CM | POA: Diagnosis not present

## 2023-05-22 LAB — LAB REPORT - SCANNED
Albumin, Urine POC: 219
EGFR: 26
Microalb Creat Ratio: 175

## 2023-06-04 DIAGNOSIS — N1832 Chronic kidney disease, stage 3b: Secondary | ICD-10-CM | POA: Diagnosis not present

## 2023-06-25 ENCOUNTER — Other Ambulatory Visit: Payer: Self-pay | Admitting: Family Medicine

## 2023-06-25 MED ORDER — ZOLPIDEM TARTRATE 10 MG PO TABS: 10.0000 mg | ORAL_TABLET | Freq: Every day | ORAL | 5 refills | Status: DC

## 2023-07-23 ENCOUNTER — Other Ambulatory Visit: Payer: Self-pay

## 2023-07-23 ENCOUNTER — Telehealth: Payer: Self-pay

## 2023-07-23 ENCOUNTER — Other Ambulatory Visit: Payer: Self-pay | Admitting: Family Medicine

## 2023-07-23 DIAGNOSIS — N2581 Secondary hyperparathyroidism of renal origin: Secondary | ICD-10-CM | POA: Diagnosis not present

## 2023-07-23 DIAGNOSIS — I10 Essential (primary) hypertension: Secondary | ICD-10-CM

## 2023-07-23 DIAGNOSIS — N1832 Chronic kidney disease, stage 3b: Secondary | ICD-10-CM | POA: Diagnosis not present

## 2023-07-23 DIAGNOSIS — I129 Hypertensive chronic kidney disease with stage 1 through stage 4 chronic kidney disease, or unspecified chronic kidney disease: Secondary | ICD-10-CM | POA: Diagnosis not present

## 2023-07-23 DIAGNOSIS — D631 Anemia in chronic kidney disease: Secondary | ICD-10-CM | POA: Diagnosis not present

## 2023-07-23 MED ORDER — OLMESARTAN MEDOXOMIL 40 MG PO TABS: 40.0000 mg | ORAL_TABLET | Freq: Every day | ORAL | 1 refills | Status: DC

## 2023-07-23 MED ORDER — ZOLPIDEM TARTRATE 10 MG PO TABS: 10.0000 mg | ORAL_TABLET | Freq: Every day | ORAL | 5 refills | Status: DC

## 2023-07-23 NOTE — Telephone Encounter (Signed)
Pt called asking for a refill on his Ambien. Last RF was 06/27/23. Pt has an appointment with you on 08/12/2023. Thanks.

## 2023-07-25 ENCOUNTER — Telehealth: Payer: Self-pay

## 2023-07-25 ENCOUNTER — Other Ambulatory Visit: Payer: Self-pay | Admitting: Family Medicine

## 2023-07-25 MED ORDER — ZOLPIDEM TARTRATE 10 MG PO TABS: 10.0000 mg | ORAL_TABLET | Freq: Every day | ORAL | 5 refills | Status: AC

## 2023-07-25 NOTE — Telephone Encounter (Signed)
Pt called and asked if Ambien can be sent to [facility] on [address]. It was sent to [facility] and his insurance isn't using them anymore. I have taken [facility] out of his chart. Thank you.

## 2023-08-12 ENCOUNTER — Ambulatory Visit: Payer: Medicare HMO | Admitting: Family Medicine

## 2023-09-07 DIAGNOSIS — E790 Hyperuricemia without signs of inflammatory arthritis and tophaceous disease: Secondary | ICD-10-CM | POA: Diagnosis not present

## 2023-09-07 DIAGNOSIS — Z1159 Encounter for screening for other viral diseases: Secondary | ICD-10-CM | POA: Diagnosis not present

## 2023-09-07 DIAGNOSIS — R972 Elevated prostate specific antigen [PSA]: Secondary | ICD-10-CM | POA: Diagnosis not present

## 2023-09-07 DIAGNOSIS — Z79899 Other long term (current) drug therapy: Secondary | ICD-10-CM | POA: Diagnosis not present

## 2023-09-07 DIAGNOSIS — N1832 Chronic kidney disease, stage 3b: Secondary | ICD-10-CM | POA: Diagnosis not present

## 2023-09-07 DIAGNOSIS — G4700 Insomnia, unspecified: Secondary | ICD-10-CM | POA: Diagnosis not present

## 2023-09-07 DIAGNOSIS — I10 Essential (primary) hypertension: Secondary | ICD-10-CM | POA: Diagnosis not present

## 2023-09-07 DIAGNOSIS — Z0000 Encounter for general adult medical examination without abnormal findings: Secondary | ICD-10-CM | POA: Diagnosis not present

## 2023-09-07 DIAGNOSIS — N400 Enlarged prostate without lower urinary tract symptoms: Secondary | ICD-10-CM | POA: Diagnosis not present

## 2023-09-07 DIAGNOSIS — R030 Elevated blood-pressure reading, without diagnosis of hypertension: Secondary | ICD-10-CM | POA: Diagnosis not present

## 2023-09-07 DIAGNOSIS — R7303 Prediabetes: Secondary | ICD-10-CM | POA: Diagnosis not present

## 2023-09-10 DIAGNOSIS — I10 Essential (primary) hypertension: Secondary | ICD-10-CM | POA: Diagnosis not present

## 2023-09-10 DIAGNOSIS — R7303 Prediabetes: Secondary | ICD-10-CM | POA: Diagnosis not present

## 2023-09-10 DIAGNOSIS — Z79899 Other long term (current) drug therapy: Secondary | ICD-10-CM | POA: Diagnosis not present

## 2023-09-10 DIAGNOSIS — E790 Hyperuricemia without signs of inflammatory arthritis and tophaceous disease: Secondary | ICD-10-CM | POA: Diagnosis not present

## 2023-09-10 DIAGNOSIS — E559 Vitamin D deficiency, unspecified: Secondary | ICD-10-CM | POA: Diagnosis not present

## 2023-09-10 DIAGNOSIS — N400 Enlarged prostate without lower urinary tract symptoms: Secondary | ICD-10-CM | POA: Diagnosis not present

## 2023-09-10 DIAGNOSIS — Z0130 Encounter for examination of blood pressure without abnormal findings: Secondary | ICD-10-CM | POA: Diagnosis not present

## 2023-09-10 DIAGNOSIS — N1832 Chronic kidney disease, stage 3b: Secondary | ICD-10-CM | POA: Diagnosis not present

## 2023-09-10 DIAGNOSIS — R972 Elevated prostate specific antigen [PSA]: Secondary | ICD-10-CM | POA: Diagnosis not present

## 2023-09-10 DIAGNOSIS — R809 Proteinuria, unspecified: Secondary | ICD-10-CM | POA: Diagnosis not present

## 2023-09-11 DIAGNOSIS — Z79899 Other long term (current) drug therapy: Secondary | ICD-10-CM | POA: Diagnosis not present

## 2023-10-15 DIAGNOSIS — R972 Elevated prostate specific antigen [PSA]: Secondary | ICD-10-CM | POA: Diagnosis not present

## 2023-10-15 DIAGNOSIS — N1832 Chronic kidney disease, stage 3b: Secondary | ICD-10-CM | POA: Diagnosis not present

## 2023-10-15 DIAGNOSIS — R059 Cough, unspecified: Secondary | ICD-10-CM | POA: Diagnosis not present

## 2023-10-15 DIAGNOSIS — E790 Hyperuricemia without signs of inflammatory arthritis and tophaceous disease: Secondary | ICD-10-CM | POA: Diagnosis not present

## 2023-10-15 DIAGNOSIS — N400 Enlarged prostate without lower urinary tract symptoms: Secondary | ICD-10-CM | POA: Diagnosis not present

## 2023-10-15 DIAGNOSIS — J988 Other specified respiratory disorders: Secondary | ICD-10-CM | POA: Diagnosis not present

## 2023-10-15 DIAGNOSIS — R7303 Prediabetes: Secondary | ICD-10-CM | POA: Diagnosis not present

## 2024-07-24 ENCOUNTER — Other Ambulatory Visit: Payer: Self-pay | Admitting: Family Medicine

## 2024-07-24 ENCOUNTER — Ambulatory Visit
Admission: RE | Admit: 2024-07-24 | Discharge: 2024-07-24 | Disposition: A | Source: Ambulatory Visit | Attending: Family Medicine

## 2024-07-24 DIAGNOSIS — J219 Acute bronchiolitis, unspecified: Secondary | ICD-10-CM
